# Patient Record
Sex: Female | Born: 1985 | Race: Black or African American | Hispanic: No | Marital: Single | Smoking: Never smoker
Health system: Southern US, Community
[De-identification: ages and names within clinical notes are randomized; demographics above are authoritative.]

## PROBLEM LIST (undated history)

## (undated) DIAGNOSIS — R51 Headache: Secondary | ICD-10-CM

## (undated) DIAGNOSIS — R87619 Unspecified abnormal cytological findings in specimens from cervix uteri: Secondary | ICD-10-CM

## (undated) HISTORY — DX: Headache: R51

## (undated) HISTORY — PX: WISDOM TOOTH EXTRACTION: SHX21

## (undated) HISTORY — PX: COLPOSCOPY VULVA: SUR281

## (undated) HISTORY — DX: Unspecified abnormal cytological findings in specimens from cervix uteri: R87.619

---

## 2009-11-11 HISTORY — PX: COLPOSCOPY VULVA W/ BIOPSY: SUR282

## 2012-08-13 ENCOUNTER — Emergency Department: Payer: Self-pay | Admitting: Emergency Medicine

## 2012-08-13 LAB — URINALYSIS, COMPLETE
Bilirubin,UR: NEGATIVE
Blood: NEGATIVE
Glucose,UR: NEGATIVE mg/dL (ref 0–75)
Nitrite: NEGATIVE
Squamous Epithelial: 13

## 2012-08-13 LAB — COMPREHENSIVE METABOLIC PANEL
Albumin: 3.6 g/dL (ref 3.4–5.0)
Alkaline Phosphatase: 36 U/L — ABNORMAL LOW (ref 50–136)
Anion Gap: 12 (ref 7–16)
BUN: 11 mg/dL (ref 7–18)
Bilirubin,Total: 0.3 mg/dL (ref 0.2–1.0)
Calcium, Total: 9.4 mg/dL (ref 8.5–10.1)
Chloride: 107 mmol/L (ref 98–107)
Creatinine: 0.85 mg/dL (ref 0.60–1.30)
Glucose: 79 mg/dL (ref 65–99)
Osmolality: 280 (ref 275–301)
Potassium: 3.2 mmol/L — ABNORMAL LOW (ref 3.5–5.1)
SGOT(AST): 20 U/L (ref 15–37)
Total Protein: 7.7 g/dL (ref 6.4–8.2)

## 2012-08-13 LAB — CBC
HCT: 40.3 % (ref 35.0–47.0)
HGB: 13.5 g/dL (ref 12.0–16.0)
MCH: 30.1 pg (ref 26.0–34.0)
MCHC: 33.5 g/dL (ref 32.0–36.0)
MCV: 90 fL (ref 80–100)
RBC: 4.48 10*6/uL (ref 3.80–5.20)

## 2012-10-26 ENCOUNTER — Ambulatory Visit: Payer: Self-pay | Admitting: Family Medicine

## 2012-10-29 ENCOUNTER — Ambulatory Visit: Payer: Self-pay | Admitting: Family Medicine

## 2012-11-09 ENCOUNTER — Ambulatory Visit (INDEPENDENT_AMBULATORY_CARE_PROVIDER_SITE_OTHER): Payer: BC Managed Care – PPO | Admitting: Family Medicine

## 2012-11-09 ENCOUNTER — Other Ambulatory Visit (HOSPITAL_COMMUNITY)
Admission: RE | Admit: 2012-11-09 | Discharge: 2012-11-09 | Disposition: A | Discharge status: Home / Self Care | Payer: BC Managed Care – PPO | Source: Ambulatory Visit | Attending: Family Medicine | Admitting: Family Medicine

## 2012-11-09 ENCOUNTER — Encounter: Payer: Self-pay | Admitting: Family Medicine

## 2012-11-09 VITALS — BP 120/92 | HR 84 | Temp 98.1°F | Ht 61.5 in | Wt 184.0 lb

## 2012-11-09 DIAGNOSIS — Z1151 Encounter for screening for human papillomavirus (HPV): Secondary | ICD-10-CM | POA: Insufficient documentation

## 2012-11-09 DIAGNOSIS — E669 Obesity, unspecified: Secondary | ICD-10-CM

## 2012-11-09 DIAGNOSIS — Z113 Encounter for screening for infections with a predominantly sexual mode of transmission: Secondary | ICD-10-CM | POA: Insufficient documentation

## 2012-11-09 DIAGNOSIS — R87810 Cervical high risk human papillomavirus (HPV) DNA test positive: Secondary | ICD-10-CM | POA: Insufficient documentation

## 2012-11-09 DIAGNOSIS — Z0000 Encounter for general adult medical examination without abnormal findings: Secondary | ICD-10-CM | POA: Insufficient documentation

## 2012-11-09 DIAGNOSIS — Z01419 Encounter for gynecological examination (general) (routine) without abnormal findings: Secondary | ICD-10-CM | POA: Insufficient documentation

## 2012-11-09 DIAGNOSIS — Z136 Encounter for screening for cardiovascular disorders: Secondary | ICD-10-CM

## 2012-11-09 LAB — HIV ANTIBODY (ROUTINE TESTING W REFLEX): HIV: NONREACTIVE

## 2012-11-09 NOTE — Patient Instructions (Addendum)
Nice to meet you. Have a wonderful New Year.  We will call you with your lab results and pap smear results ( you will get a letter if pap smear is normal).  Try MyFitness Pal App.

## 2012-11-09 NOTE — Progress Notes (Signed)
Subjective:    Patient ID: Heidi Paul, female    DOB: 09/26/1986, 26 y.o.   MRN: [id number]  HPI 26 yo G0 here to establish care and for CPX/Pap.  Sexually active with fiance only, uses OCPs. Would like STD testing with pelvic today.  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  Trying to lose weight- does not exercise.  Did lose 15 pounds with Atkins diet previously.  Patient Active Problem List  Diagnosis  . Routine general medical examination at a health care facility  . Obesity (BMI 30.0-34.9)   No past medical history on file. No past surgical history on file. History  Substance Use Topics  . Smoking status: Never Smoker   . Smokeless tobacco: Not on file  . Alcohol Use: Not on file   Family History  Problem Relation Age of Onset  . Cancer Mother     prostate  . Diabetes Maternal Grandmother   . Hyperlipidemia Maternal Grandmother   . Hypertension Maternal Grandmother    Allergies  Allergen Reactions  . Biaxin (Clarithromycin) Hives and Swelling   Current Outpatient Prescriptions on File Prior to Visit  Medication Sig Dispense Refill  . desogestrel-ethinyl estradiol (KARIVA,AZURETTE,MIRCETTE) 0.15-0.02/0.01 MG (21/5) tablet Take 1 tablet by mouth daily.       The PMH, PSH, Social History, Family History, Medications, and allergies have been reviewed in [facility], and have been updated if relevant.    Review of Systems See HPI Patient reports no  vision/ hearing changes,anorexia, weight change, fever ,adenopathy, persistant / recurrent hoarseness, swallowing issues, chest pain, edema,persistant / recurrent cough, hemoptysis, dyspnea(rest, exertional, paroxysmal nocturnal), gastrointestinal  bleeding (melena, rectal bleeding), abdominal pain, excessive heart burn, GU symptoms(dysuria, hematuria, pyuria, voiding/incontinence  Issues) syncope, focal weakness, severe memory loss, concerning skin lesions, depression, anxiety, abnormal  bruising/bleeding, major joint swelling, breast masses or abnormal vaginal bleeding.       Objective:   Physical Exam BP 120/92  Pulse 84  Temp 98.1 F (36.7 C)  Ht 5' 1.5" (1.562 m)  Wt 184 lb (83.462 kg)  BMI 34.20 kg/m2  General:  Well-developed,well-nourished,in no acute distress; alert,appropriate and cooperative throughout examination Head:  normocephalic and atraumatic.   Eyes:  vision grossly intact, pupils equal, pupils round, and pupils reactive to light.   Ears:  R ear normal and L ear normal.   Nose:  no external deformity.   Mouth:  good dentition.   Neck:  No deformities, masses, or tenderness noted. Breasts:  No mass, nodules, thickening, tenderness, bulging, retraction, inflamation, nipple discharge or skin changes noted.   Lungs:  Normal respiratory effort, chest expands symmetrically. Lungs are clear to auscultation, no crackles or wheezes. Heart:  Normal rate and regular rhythm. S1 and S2 normal without gallop, murmur, click, rub or other extra sounds. Abdomen:  Bowel sounds positive,abdomen soft and non-tender without masses, organomegaly or hernias noted. Rectal:  no external abnormalities.   Genitalia:  Pelvic Exam:        External: normal female genitalia without lesions or masses        Vagina: normal without lesions or masses        Cervix: normal without lesions or masses        Adnexa: normal bimanual exam without masses or fullness        Uterus: normal by palpation        Pap smear: performed Msk:  No deformity or scoliosis noted of thoracic or lumbar spine.  Extremities:  No clubbing, cyanosis, edema, or deformity noted with normal full range of motion of all joints.   Neurologic:  alert & oriented X3 and gait normal.   Skin:  Intact without suspicious lesions or rashes Cervical Nodes:  No lymphadenopathy noted Axillary Nodes:  No palpable lymphadenopathy Psych:  Cognition and judgment appear intact. Alert and cooperative with normal attention span  and concentration. No apparent delusions, illusions, hallucinations        Assessment & Plan:   1. Routine general medical examination at a health care facility  Reviewed preventive care protocols, scheduled due services, and updated immunizations Discussed nutrition, exercise, diet, and healthy lifestyle.  Comprehensive metabolic panel, Cytology - PAP  2. Screening for STD (sexually transmitted disease)  HIV Antibody, RPR, Cytology - PAP  3. Screening for ischemic heart disease  Lipid Panel  4. Obesity (BMI 30.0-34.9)  Discussed food journals, try weight watchers or my fitness pal. Encouraged to get regular exercise.

## 2012-11-10 LAB — COMPREHENSIVE METABOLIC PANEL
ALT: 17 U/L (ref 0–35)
AST: 18 U/L (ref 0–37)
Calcium: 8.7 mg/dL (ref 8.4–10.5)
Chloride: 104 mEq/L (ref 96–112)
Creatinine, Ser: 0.9 mg/dL (ref 0.4–1.2)
Sodium: 136 mEq/L (ref 135–145)
Total Bilirubin: 0.3 mg/dL (ref 0.3–1.2)

## 2012-11-10 LAB — RPR

## 2012-11-10 LAB — LIPID PANEL
HDL: 96.8 mg/dL (ref >39.00)
VLDL: 33 mg/dL (ref 0.0–40.0)

## 2012-11-11 HISTORY — PX: LEEP: SHX91

## 2012-11-20 ENCOUNTER — Other Ambulatory Visit: Payer: Self-pay | Admitting: Family Medicine

## 2012-11-30 ENCOUNTER — Ambulatory Visit (INDEPENDENT_AMBULATORY_CARE_PROVIDER_SITE_OTHER): Payer: BC Managed Care – PPO | Admitting: Obstetrics and Gynecology

## 2012-11-30 ENCOUNTER — Encounter: Payer: Self-pay | Admitting: Obstetrics and Gynecology

## 2012-11-30 VITALS — BP 125/92 | HR 126 | Ht 61.0 in | Wt 186.0 lb

## 2012-11-30 DIAGNOSIS — R87610 Atypical squamous cells of undetermined significance on cytologic smear of cervix (ASC-US): Secondary | ICD-10-CM

## 2012-11-30 DIAGNOSIS — R87611 Atypical squamous cells cannot exclude high grade squamous intraepithelial lesion on cytologic smear of cervix (ASC-H): Secondary | ICD-10-CM

## 2012-11-30 DIAGNOSIS — N871 Moderate cervical dysplasia: Secondary | ICD-10-CM | POA: Insufficient documentation

## 2012-11-30 DIAGNOSIS — Z01812 Encounter for preprocedural laboratory examination: Secondary | ICD-10-CM

## 2012-11-30 NOTE — Progress Notes (Signed)
Patient ID: Heidi Paul, female   DOB: 1985-11-24, 27 y.o.   MRN: [id number] 27 yo G0 non-smoker with ASC-H on 11/09/2012 pap smear. Patient given informed consent, signed copy in the chart, time out was performed.  Placed in lithotomy position. Cervix viewed with speculum and colposcope after application of acetic acid.   Colposcopy adequate?  yes Acetowhite lesions?yes 10 o'clock Punctation?no Mosaicism?  no Abnormal vasculature?  no Biopsies?yes 10 o'clock ECC?yes  COMMENTS: Patient was given post procedure instructions.  She will return in 2 weeks for results. Patient completed Gardasil series in 2011 Patient with h/o abnormal pap smear in the past- will review records

## 2012-12-15 ENCOUNTER — Ambulatory Visit: Payer: BC Managed Care – PPO | Admitting: Obstetrics & Gynecology

## 2012-12-26 ENCOUNTER — Other Ambulatory Visit: Payer: Self-pay

## 2013-01-01 ENCOUNTER — Ambulatory Visit (INDEPENDENT_AMBULATORY_CARE_PROVIDER_SITE_OTHER): Payer: BC Managed Care – PPO | Admitting: Obstetrics and Gynecology

## 2013-01-01 ENCOUNTER — Other Ambulatory Visit: Payer: Self-pay | Admitting: Obstetrics and Gynecology

## 2013-01-01 VITALS — BP 124/97 | HR 126 | Ht 61.0 in | Wt 188.0 lb

## 2013-01-01 DIAGNOSIS — N871 Moderate cervical dysplasia: Secondary | ICD-10-CM

## 2013-01-01 DIAGNOSIS — Z01812 Encounter for preprocedural laboratory examination: Secondary | ICD-10-CM

## 2013-01-01 HISTORY — DX: Moderate cervical dysplasia: N87.1

## 2013-01-01 NOTE — Progress Notes (Signed)
Patient ID: Heidi Paul, female   DOB: [birth year], 27 y.o.   MRN: [id number] Patient identified, informed consent obtained, signed copy in chart, time out performed.  Pap smear and colposcopy reviewed.  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  Cervix visualized. Paracervical block placed.  Medium size LOOP used to remove cone of cervix using blend of cut and cautery on LEEP machine.  Edges/Base cauterized with Ball.  Monsel's solution used for hemostasis.  Patient tolerated procedure well.  Patient given post procedure instructions.  Follow up in 6 months for repeat pap or as needed.

## 2013-01-01 NOTE — Addendum Note (Signed)
Addended by: Barbara Cower on: 01/01/2013 10:33 AM   Modules accepted: Orders

## 2013-01-01 NOTE — Patient Instructions (Signed)
Loop Electrosurgical Excision Procedure  Care After  Refer to this sheet in the next few weeks. These instructions provide you with information on caring for yourself after your procedure. Your caregiver may also give you more specific instructions. Your treatment has been planned according to current medical practices, but problems sometimes occur. Call your caregiver if you have any problems or questions after your procedure.  HOME CARE INSTRUCTIONS   · Do not use tampons, douche, or have sexual intercourse for 2 weeks or as directed by your caregiver.  · Begin normal activities if you have no or minimal cramping or bleeding, unless directed otherwise by your caregiver.  · Take your temperature if you feel sick. Write down your temperature on paper, and tell your caregiver if you have a fever.  · Take all medicines as directed by your caregiver.  · Keep all your follow-up appointments and Pap tests as directed by your caregiver.  SEEK IMMEDIATE MEDICAL CARE IF:   · You have bleeding that is heavier or longer than a normal menstrual cycle.  · You have bleeding that is bright red.  · You have blood clots.  · You have a fever.  · You have increasing cramps or pain not relieved by medicine.  · You develop abdominal pain that does not seem to be related to the same area of earlier cramping and pain.  · You are lightheaded, unusually weak, or faint.  · You develop painful or bloody urination.  · You develop a bad smelling vaginal discharge.  MAKE SURE YOU:  · Understand these instructions.  · Will watch your condition.  · Will get help right away if you are not doing well or get worse.  Document Released: 07/11/2011 Document Revised: 01/20/2012 Document Reviewed: 07/11/2011  Exit[employer]® Patient Information ©2013 [employer].

## 2013-01-19 ENCOUNTER — Ambulatory Visit: Payer: BC Managed Care – PPO | Admitting: Obstetrics and Gynecology

## 2013-03-09 ENCOUNTER — Other Ambulatory Visit: Payer: Self-pay | Admitting: Family Medicine

## 2013-08-25 ENCOUNTER — Other Ambulatory Visit: Payer: Self-pay | Admitting: Family Medicine

## 2013-09-16 ENCOUNTER — Other Ambulatory Visit: Payer: Self-pay

## 2013-09-21 ENCOUNTER — Other Ambulatory Visit (HOSPITAL_COMMUNITY)
Admission: RE | Admit: 2013-09-21 | Discharge: 2013-09-21 | Disposition: A | Discharge status: Home / Self Care | Payer: BC Managed Care – PPO | Source: Ambulatory Visit | Attending: Internal Medicine | Admitting: Internal Medicine

## 2013-09-21 ENCOUNTER — Encounter: Payer: Self-pay | Admitting: Internal Medicine

## 2013-09-21 ENCOUNTER — Ambulatory Visit (INDEPENDENT_AMBULATORY_CARE_PROVIDER_SITE_OTHER): Payer: BC Managed Care – PPO | Admitting: Internal Medicine

## 2013-09-21 VITALS — BP 120/80 | HR 102 | Temp 98.6°F | Ht 62.0 in | Wt 190.0 lb

## 2013-09-21 DIAGNOSIS — E669 Obesity, unspecified: Secondary | ICD-10-CM | POA: Insufficient documentation

## 2013-09-21 DIAGNOSIS — Z01419 Encounter for gynecological examination (general) (routine) without abnormal findings: Secondary | ICD-10-CM | POA: Insufficient documentation

## 2013-09-21 DIAGNOSIS — N760 Acute vaginitis: Secondary | ICD-10-CM | POA: Insufficient documentation

## 2013-09-21 DIAGNOSIS — B373 Candidiasis of vulva and vagina: Secondary | ICD-10-CM

## 2013-09-21 DIAGNOSIS — Z0000 Encounter for general adult medical examination without abnormal findings: Secondary | ICD-10-CM

## 2013-09-21 DIAGNOSIS — Z113 Encounter for screening for infections with a predominantly sexual mode of transmission: Secondary | ICD-10-CM | POA: Insufficient documentation

## 2013-09-21 HISTORY — DX: Obesity, unspecified: E66.9

## 2013-09-21 LAB — COMPREHENSIVE METABOLIC PANEL
Alkaline Phosphatase: 28 U/L — ABNORMAL LOW (ref 39–117)
CO2: 26 mEq/L (ref 19–32)
Creatinine, Ser: 0.9 mg/dL (ref 0.4–1.2)
GFR: 101.95 mL/min (ref >60.00)
Glucose, Bld: 87 mg/dL (ref 70–99)
Sodium: 135 mEq/L (ref 135–145)
Total Bilirubin: 0.4 mg/dL (ref 0.3–1.2)
Total Protein: 7.7 g/dL (ref 6.0–8.3)

## 2013-09-21 LAB — CBC
HCT: 41.8 % (ref 36.0–46.0)
Hemoglobin: 13.8 g/dL (ref 12.0–15.0)
MCV: 90 fl (ref 78.0–100.0)
RBC: 4.64 Mil/uL (ref 3.87–5.11)

## 2013-09-21 LAB — LIPID PANEL
Cholesterol: 269 mg/dL — ABNORMAL HIGH (ref 0–200)
HDL: 95.6 mg/dL (ref >39.00)
Total CHOL/HDL Ratio: 3
Triglycerides: 132 mg/dL (ref 0.0–149.0)
VLDL: 26.4 mg/dL (ref 0.0–40.0)

## 2013-09-21 LAB — LDL CHOLESTEROL, DIRECT: Direct LDL: 158.4 mg/dL

## 2013-09-21 MED ORDER — FLUCONAZOLE 150 MG PO TABS: 150.0000 mg | ORAL_TABLET | Freq: Once | ORAL | Status: DC

## 2013-09-21 NOTE — Progress Notes (Signed)
Pre-visit discussion using our clinic review tool. No additional management support is needed unless otherwise documented below in the visit note.  

## 2013-09-21 NOTE — Progress Notes (Signed)
Subjective:    Patient ID: Heidi Paul, female    DOB: 09-18-1986, 27 y.o.   MRN: [id number]  HPI  Pt presents to the clinic today for her annual exam. She is sexually active. She is on BCP, she has not missed any pills. She would like to be screen for STI's today.  Flu: never Tetanus: more than 10 years ago LMP: 08/28/2013 Pap Smear: 10/2012 (hx of abnormal cells)  Review of Systems      Past Medical History  Diagnosis Date  . Headache(784.0)      every other day.  . Abnormal Pap smear of cervix     x 3 , show positive hpv    Current Outpatient Prescriptions  Medication Sig Dispense Refill  . desogestrel-ethinyl estradiol (VIORELE) 0.15-0.02/0.01 MG (21/5) tablet TAKE 1 TABLET BY MOUTH EVERY DAY  28 tablet  2   No current facility-administered medications for this visit.    Allergies  Allergen Reactions  . Biaxin [Clarithromycin] Hives and Swelling    Family History  Problem Relation Age of Onset  . Diabetes Maternal Grandmother   . Hyperlipidemia Maternal Grandmother   . Hypertension Maternal Grandmother   . Cancer Father     prostate  . Hyperlipidemia Father   . Cancer Paternal Grandfather     prostate  . Hyperlipidemia Paternal Grandfather   . Hypertension Paternal Grandfather     History   Social History  . Marital Status: Single    Spouse Name: N/A    Number of Children: N/A  . Years of Education: N/A   Occupational History  . Not on file.   Social History Main Topics  . Smoking status: Never Smoker   . Smokeless tobacco: Not on file  . Alcohol Use: Yes     Comment: occasion  . Drug Use: Not on file  . Sexual Activity: Yes    Partners: Male    Birth Control/ Protection: Pill   Other Topics Concern  . 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     Constitutional: Denies fever, malaise, fatigue, headache or abrupt weight changes.  HEENT: Denies eye pain, eye redness, ear pain, ringing in the ears, wax  buildup, runny nose, nasal congestion, bloody nose, or sore throat. Respiratory: Denies difficulty breathing, shortness of breath, cough or sputum production.   Cardiovascular: Denies chest pain, chest tightness, palpitations or swelling in the hands or feet.  Gastrointestinal: Denies abdominal pain, bloating, constipation, diarrhea or blood in the stool.  GU: Denies urgency, frequency, pain with urination, burning sensation, blood in urine, odor or discharge. Musculoskeletal: Denies decrease in range of motion, difficulty with gait, muscle pain or joint pain and swelling.  Skin: Denies redness, rashes, lesions or ulcercations.  Neurological: Denies dizziness, difficulty with memory, difficulty with speech or problems with balance and coordination.   No other specific complaints in a complete review of systems (except as listed in HPI above).  Objective:   Physical Exam  BP 120/80  Pulse 102  Temp(Src) 98.6 F (37 C) (Tympanic)  Ht 5\' 2"  (1.575 m)  Wt 190 lb (86.183 kg)  BMI 34.74 kg/m2  SpO2 98% Wt Readings from Last 3 Encounters:  09/21/13 190 lb (86.183 kg)  01/01/13 188 lb (85.276 kg)  11/30/12 186 lb (84.369 kg)    Constitutional:  Alert, oriented x 4, well developed, well nourished in no apparent distress. Skin: Skin is warm and dry.  No erythema, lesion or  ulceration noted. HEENT: Head: normal shape and size; Eyes: sclera white, no icterus, conjunctiva pink, PERRLA and EOMs intact; Ears: Tm's gray and intact, normal light reflex; Nose: mucosa pink and moist, septum midline; Throat/Mouth: Teeth present, , mucosa pink and moist, no lesions or ulcerations noted. Neck: Normal range of motion. Neck supple, trachea midline. No massses, lumps or thyromegaly present.  Cardiovascular: Normal rate and rhythm. S1,S2 noted.  No murmur, rubs or gallops noted. No JVD or BLE edema. No carotid bruits noted. Pulmonary/Chest: Normal effort and positive vesicular breath sounds. No respiratory  distress. No wheezes, rales or ronchi noted.  Abdomenl: Soft and nontender. Normal bowel sounds, no bruits noted. No distention or masses noted. Liver, spleen and kidneys non palpable. Genitourinary: Normal female anatomy. Uterus midline, anterior and soft. No CMT or discharge noted. Adenexa non palpable. Breast without lumps or masses.  Musculoskeletal: Normal range of motion. Patient exhibits no effusions.  Neurological: Alert and oriented. Cranial nerves II-XII intact. Coordination normal. +DTRs bilaterally. Psychiatric: She has a normal mood and affect. Behavior is normal. Judgment and thought content normal.          Assessment & Plan:   Preventative Health Maintenance:  Screening blood work today including HIV and RPR Advised pt to incorporate more fruits and veggies into her diet and exercise on a regular basis Pap smear done today including STI screen- will call you with the results eRx for diflucan 150 mg po x 1 for vaginal yeast  RTC in 1 year or sooner if needed

## 2013-09-21 NOTE — Patient Instructions (Signed)

## 2013-09-22 LAB — HIV ANTIBODY (ROUTINE TESTING W REFLEX): HIV: NONREACTIVE

## 2013-11-16 ENCOUNTER — Other Ambulatory Visit: Payer: Self-pay | Admitting: Family Medicine

## 2014-11-17 ENCOUNTER — Other Ambulatory Visit: Payer: Self-pay | Admitting: Family Medicine

## 2014-11-21 ENCOUNTER — Other Ambulatory Visit: Payer: Self-pay

## 2014-11-21 MED ORDER — DESOGESTREL-ETHINYL ESTRADIOL 0.15-0.02/0.01 MG (21/5) PO TABS: 1.0000 | ORAL_TABLET | Freq: Every day | ORAL | Status: DC

## 2014-11-21 NOTE — Telephone Encounter (Signed)
t left v/m requesting refill viorele to [facility]; pt has CPX scheduled 02/06/15; spoke with Vicente Males at [facility] and [facility] is same med as on pts med list. Pt did not require cb.

## 2015-01-27 ENCOUNTER — Other Ambulatory Visit: Payer: Self-pay | Admitting: Family Medicine

## 2015-01-27 DIAGNOSIS — Z0000 Encounter for general adult medical examination without abnormal findings: Secondary | ICD-10-CM

## 2015-01-30 ENCOUNTER — Other Ambulatory Visit (INDEPENDENT_AMBULATORY_CARE_PROVIDER_SITE_OTHER): Payer: BC Managed Care – PPO

## 2015-01-30 DIAGNOSIS — Z0000 Encounter for general adult medical examination without abnormal findings: Secondary | ICD-10-CM

## 2015-01-31 LAB — CBC WITH DIFFERENTIAL/PLATELET
Basophils Absolute: 0 10*3/uL (ref 0.0–0.1)
Basophils Relative: 0.6 % (ref 0.0–3.0)
EOS PCT: 1.1 % (ref 0.0–5.0)
Eosinophils Absolute: 0.1 10*3/uL (ref 0.0–0.7)
HCT: 41.3 % (ref 36.0–46.0)
Hemoglobin: 13.8 g/dL (ref 12.0–15.0)
Lymphocytes Relative: 50.3 % — ABNORMAL HIGH (ref 12.0–46.0)
Lymphs Abs: 4 10*3/uL (ref 0.7–4.0)
MCHC: 33.5 g/dL (ref 30.0–36.0)
MCV: 88.5 fl (ref 78.0–100.0)
MONO ABS: 0.5 10*3/uL (ref 0.1–1.0)
Monocytes Relative: 6 % (ref 3.0–12.0)
Neutro Abs: 3.4 10*3/uL (ref 1.4–7.7)
Neutrophils Relative %: 42 % — ABNORMAL LOW (ref 43.0–77.0)
Platelets: 391 10*3/uL (ref 150.0–400.0)
RBC: 4.67 Mil/uL (ref 3.87–5.11)
RDW: 13.7 % (ref 11.5–15.5)
WBC: 8 10*3/uL (ref 4.0–10.5)

## 2015-01-31 LAB — COMPREHENSIVE METABOLIC PANEL
ALK PHOS: 25 U/L — AB (ref 39–117)
ALT: 10 U/L (ref 0–35)
AST: 16 U/L (ref 0–37)
Albumin: 4.1 g/dL (ref 3.5–5.2)
BUN: 13 mg/dL (ref 6–23)
CALCIUM: 9.5 mg/dL (ref 8.4–10.5)
CHLORIDE: 103 meq/L (ref 96–112)
CO2: 24 mEq/L (ref 19–32)
CREATININE: 0.88 mg/dL (ref 0.40–1.20)
GFR: 98.28 mL/min (ref >60.00)
Glucose, Bld: 81 mg/dL (ref 70–99)
Potassium: 3.8 mEq/L (ref 3.5–5.1)
Sodium: 135 mEq/L (ref 135–145)
Total Bilirubin: 0.3 mg/dL (ref 0.2–1.2)
Total Protein: 7.3 g/dL (ref 6.0–8.3)

## 2015-01-31 LAB — LIPID PANEL
CHOLESTEROL: 245 mg/dL — AB (ref 0–200)
HDL: 79.1 mg/dL (ref >39.00)
LDL CALC: 136 mg/dL — AB (ref 0–99)
NonHDL: 165.9
TRIGLYCERIDES: 149 mg/dL (ref 0.0–149.0)
Total CHOL/HDL Ratio: 3
VLDL: 29.8 mg/dL (ref 0.0–40.0)

## 2015-01-31 LAB — TSH: TSH: 1.8 u[IU]/mL (ref 0.35–4.50)

## 2015-02-04 ENCOUNTER — Other Ambulatory Visit: Payer: Self-pay | Admitting: Family Medicine

## 2015-02-06 ENCOUNTER — Ambulatory Visit (INDEPENDENT_AMBULATORY_CARE_PROVIDER_SITE_OTHER): Payer: BC Managed Care – PPO | Admitting: Family Medicine

## 2015-02-06 ENCOUNTER — Other Ambulatory Visit (HOSPITAL_COMMUNITY)
Admission: RE | Admit: 2015-02-06 | Discharge: 2015-02-06 | Disposition: A | Discharge status: Home / Self Care | Payer: BC Managed Care – PPO | Source: Ambulatory Visit | Attending: Family Medicine | Admitting: Family Medicine

## 2015-02-06 ENCOUNTER — Encounter: Payer: Self-pay | Admitting: Family Medicine

## 2015-02-06 VITALS — BP 116/74 | HR 88 | Temp 99.0°F | Ht 61.5 in | Wt 199.8 lb

## 2015-02-06 DIAGNOSIS — N760 Acute vaginitis: Secondary | ICD-10-CM | POA: Insufficient documentation

## 2015-02-06 DIAGNOSIS — Z01419 Encounter for gynecological examination (general) (routine) without abnormal findings: Secondary | ICD-10-CM | POA: Insufficient documentation

## 2015-02-06 DIAGNOSIS — Z113 Encounter for screening for infections with a predominantly sexual mode of transmission: Secondary | ICD-10-CM | POA: Insufficient documentation

## 2015-02-06 DIAGNOSIS — Z1151 Encounter for screening for human papillomavirus (HPV): Secondary | ICD-10-CM | POA: Insufficient documentation

## 2015-02-06 DIAGNOSIS — E669 Obesity, unspecified: Secondary | ICD-10-CM

## 2015-02-06 NOTE — Assessment & Plan Note (Signed)
Reviewed preventive care protocols, scheduled due services, and updated immunizations Discussed nutrition, exercise, diet, and healthy lifestyle.  Pap smear done today.  Declines influenza vaccine.

## 2015-02-06 NOTE — Progress Notes (Signed)
Subjective:   Patient ID: Heidi Paul, female    DOB: July 08, 1986, 29 y.o.   MRN: [id number]  Heidi Paul is a pleasant 29 y.o. year old female who presents to clinic today with Annual Exam  on 02/06/2015  HPI:  Last pap smear 09/23/13- done by Webb Silversmith. 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   Since that time, pap smears have been normal.  On OCPs.  No spotting or issues with current OCP.  Obesity- has been working with myfitness pal to decrease caloric intake.  Has not been exercising much though.  Lab Results  Component Value Date   WBC 8.0 01/30/2015   HGB 13.8 01/30/2015   HCT 41.3 01/30/2015   MCV 88.5 01/30/2015   PLT 391.0 01/30/2015   Lab Results  Component Value Date   CHOL 245* 01/30/2015   HDL 79.10 01/30/2015   LDLCALC 136* 01/30/2015   LDLDIRECT 158.4 09/21/2013   TRIG 149.0 01/30/2015   CHOLHDL 3 01/30/2015   Lab Results  Component Value Date   CREATININE 0.88 01/30/2015   Lab Results  Component Value Date   TSH 1.80 01/30/2015   Lab Results  Component Value Date   ALT 10 01/30/2015   AST 16 01/30/2015   ALKPHOS 25* 01/30/2015   BILITOT 0.3 01/30/2015   Current Outpatient Prescriptions on File Prior to Visit  Medication Sig Dispense Refill  . desogestrel-ethinyl estradiol (KARIVA,AZURETTE,MIRCETTE) 0.15-0.02/0.01 MG (21/5) tablet Take 1 tablet by mouth daily. 28 tablet 2   No current facility-administered medications on file prior to visit.    Allergies  Allergen Reactions  . Biaxin [Clarithromycin] Hives and Swelling    Past Medical History  Diagnosis Date  . Headache(784.0)      every other day.  . Abnormal Pap smear of cervix     x 3 , show positive hpv    Past Surgical History  Procedure Laterality Date  . Wisdom tooth extraction      x4  . Colposcopy vulva w/ biopsy  2011    abnormal pap/ hpv  . Colposcopy vulva  2012 and 2011    x2 /positive hpv    Family History  Problem Relation Age of Onset  .  Diabetes Maternal Grandmother   . Hyperlipidemia Maternal Grandmother   . Hypertension Maternal Grandmother   . Cancer Father     prostate  . Hyperlipidemia Father   . Cancer Paternal Grandfather     prostate  . Hyperlipidemia Paternal Grandfather   . Hypertension Paternal Grandfather     History   Social History  . Marital Status: Single    Spouse Name: N/A  . Number of Children: N/A  . Years of Education: N/A   Occupational History  . Not on file.   Social History Main Topics  . Smoking status: Never Smoker   . Smokeless tobacco: Not on file  . Alcohol Use: Yes     Comment: occasion  . Drug Use: Not on file  . Sexual Activity:    Partners: Male    Birth Control/ Protection: Pill   Other Topics Concern  . 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     Review of Systems  Constitutional: Negative.   HENT: Negative.   Eyes: Negative.   Respiratory: Negative.   Cardiovascular: Negative.   Gastrointestinal: Negative.   Endocrine: Negative.   Genitourinary: Negative.  Negative for difficulty urinating.  Musculoskeletal:  Negative.   Skin: Negative.   Allergic/Immunologic: Negative.   Neurological: Negative.   Hematological: Negative.   Psychiatric/Behavioral: Negative.   All other systems reviewed and are negative.      Objective:    BP 116/74 mmHg  Pulse 88  Temp(Src) 99 F (37.2 C) (Oral)  Ht 5' 1.5" (1.562 m)  Wt 199 lb 12 oz (90.606 kg)  BMI 37.14 kg/m2  SpO2 96%  LMP 01/10/2015 (Within Days)   Physical Exam    General:  Well-developed,well-nourished,in no acute distress; alert,appropriate and cooperative throughout examination Head:  normocephalic and atraumatic.   Eyes:  vision grossly intact, pupils equal, pupils round, and pupils reactive to light.   Ears:  R ear normal and L ear normal.   Nose:  no external deformity.   Mouth:  good dentition.   Neck:  No deformities, masses, or tenderness  noted. Breasts:  No mass, nodules, thickening, tenderness, bulging, retraction, inflamation, nipple discharge or skin changes noted.   Lungs:  Normal respiratory effort, chest expands symmetrically. Lungs are clear to auscultation, no crackles or wheezes. Heart:  Normal rate and regular rhythm. S1 and S2 normal without gallop, murmur, click, rub or other extra sounds. Abdomen:  Bowel sounds positive,abdomen soft and non-tender without masses, organomegaly or hernias noted. Rectal:  no external abnormalities.   Genitalia:  Pelvic Exam:        External: normal female genitalia without lesions or masses        Vagina: normal without lesions or masses        Cervix: normal without lesions or masses        Adnexa: normal bimanual exam without masses or fullness        Uterus: normal by palpation        Pap smear: performed Msk:  No deformity or scoliosis noted of thoracic or lumbar spine.   Extremities:  No clubbing, cyanosis, edema, or deformity noted with normal full range of motion of all joints.   Neurologic:  alert & oriented X3 and gait normal.   Skin:  Intact without suspicious lesions or rashes Cervical Nodes:  No lymphadenopathy noted Axillary Nodes:  No palpable lymphadenopathy Psych:  Cognition and judgment appear intact. Alert and cooperative with normal attention span and concentration. No apparent delusions, illusions, hallucinations      Assessment & Plan:   Well woman exam with routine gynecological exam  Obesity (BMI 30.0-34.9)  Screening for STD (sexually transmitted disease) - Plan: HIV antibody (with reflex), RPR No Follow-up on file.

## 2015-02-06 NOTE — Assessment & Plan Note (Signed)
Discussed increasing physical activity to at least 30 minutes per day.

## 2015-02-06 NOTE — Addendum Note (Signed)
Addended by: Modena Nunnery on: 02/06/2015 08:55 AM   Modules accepted: Orders

## 2015-02-06 NOTE — Patient Instructions (Signed)
Great to see you.  We will call you with your results. 

## 2015-02-06 NOTE — Progress Notes (Signed)
Pre visit review using our clinic review tool, if applicable. No additional management support is needed unless otherwise documented below in the visit note. 

## 2015-02-07 LAB — RPR

## 2015-02-07 LAB — HIV ANTIBODY (ROUTINE TESTING W REFLEX): HIV 1&2 Ab, 4th Generation: NONREACTIVE

## 2015-02-08 ENCOUNTER — Encounter: Payer: Self-pay | Admitting: *Deleted

## 2015-02-08 LAB — CERVICOVAGINAL ANCILLARY ONLY: Candida vaginitis: NEGATIVE

## 2015-02-08 LAB — CYTOLOGY - PAP

## 2015-02-13 LAB — CERVICOVAGINAL ANCILLARY ONLY: Herpes: NEGATIVE

## 2015-03-20 ENCOUNTER — Ambulatory Visit (INDEPENDENT_AMBULATORY_CARE_PROVIDER_SITE_OTHER): Payer: BC Managed Care – PPO | Admitting: Family Medicine

## 2015-03-20 ENCOUNTER — Encounter: Payer: Self-pay | Admitting: Family Medicine

## 2015-03-20 VITALS — BP 126/74 | HR 124 | Temp 98.1°F | Wt 196.0 lb

## 2015-03-20 DIAGNOSIS — N912 Amenorrhea, unspecified: Secondary | ICD-10-CM

## 2015-03-20 DIAGNOSIS — Z113 Encounter for screening for infections with a predominantly sexual mode of transmission: Secondary | ICD-10-CM

## 2015-03-20 HISTORY — DX: Amenorrhea, unspecified: N91.2

## 2015-03-20 NOTE — Progress Notes (Signed)
   Subjective:   Patient ID: Heidi Paul, female    DOB: May 11, 1986, 29 y.o.   MRN: [id number]  Heidi Paul is a pleasant 29 y.o. year old female who presents to clinic today with Amenorrhea  on 03/20/2015  HPI:  Amenorrhea- has not had a full period since March.  Has been on same OCP since high school and has never had this issue in past.  Has not missed any doses.  She has had unprotected sex. Denies any nausea, headache, or breast tenderness.  Has taken 3 HPTs, all negative.  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 Current Outpatient Prescriptions on File Prior to Visit  Medication Sig Dispense Refill  . VIORELE 0.15-0.02/0.01 MG (21/5) tablet TAKE 1 TABLET BY MOUTH DAILY. 28 tablet 11   No current facility-administered medications on file prior to visit.    Allergies  Allergen Reactions  . Biaxin [Clarithromycin] Hives and Swelling    Past Medical History  Diagnosis Date  . Headache(784.0)      every other day.  . Abnormal Pap smear of cervix     x 3 , show positive hpv    Past Surgical History  Procedure Laterality Date  . Wisdom tooth extraction      x4  . Colposcopy vulva w/ biopsy  2011    abnormal pap/ hpv  . Colposcopy vulva  2012 and 2011    x2 /positive hpv    Family History  Problem Relation Age of Onset  . Diabetes Maternal Grandmother   . Hyperlipidemia Maternal Grandmother   . Hypertension Maternal Grandmother   . Cancer Father     prostate  . Hyperlipidemia Father   . Cancer Paternal Grandfather     prostate  . Hyperlipidemia Paternal Grandfather   . Hypertension Paternal Grandfather     History   Social History  . Marital Status: Single    Spouse Name: N/A  . Number of Children: N/A  . Years of Education: N/A   Occupational History  . Not on file.   Social History Main Topics  . Smoking status: Never Smoker   . Smokeless tobacco: Not on file  . Alcohol Use: Yes     Comment:  occasion  . Drug Use: Not on file  . Sexual Activity:    Partners: Male    Birth Control/ Protection: Pill   Other Topics Concern  . 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   The PMH, PSH, Social History, Family History, Medications, and allergies have been reviewed in [facility], and have been updated if relevant.   Review of Systems  Constitutional: Negative.   Gastrointestinal: Negative.   Genitourinary: Positive for menstrual problem. Negative for pelvic pain.  Psychiatric/Behavioral: Negative for suicidal ideas, behavioral problems, confusion, sleep disturbance and dysphoric mood. The patient is nervous/anxious. The patient is not hyperactive.   All other systems reviewed and are negative.      Objective:    BP 126/74 mmHg  Pulse 124  Temp(Src) 98.1 F (36.7 C) (Oral)  Wt 196 lb (88.905 kg)  SpO2 97%  LMP 01/10/2015 (Within Days)   Physical Exam        Assessment & Plan:   Amenorrhea - Plan: hCG, quantitative, pregnancy  Screening for STD (sexually transmitted disease) - Plan: HIV antibody (with reflex), RPR No Follow-up on file.

## 2015-03-20 NOTE — Assessment & Plan Note (Signed)
New- likely due to acute stressors. Check hcg serum for pt reassurance. The patient indicates understanding of these issues and agrees with the plan.

## 2015-03-20 NOTE — Assessment & Plan Note (Signed)
Orders Placed This Encounter  Procedures  . hCG, quantitative, pregnancy  . HIV antibody (with reflex)  . RPR

## 2015-03-20 NOTE — Progress Notes (Signed)
Pre visit review using our clinic review tool, if applicable. No additional management support is needed unless otherwise documented below in the visit note. 

## 2015-03-21 ENCOUNTER — Telehealth: Payer: Self-pay | Admitting: Family Medicine

## 2015-03-21 LAB — RPR

## 2015-03-21 LAB — HCG, QUANTITATIVE, PREGNANCY: Quantitative HCG: 0.2 m[IU]/mL

## 2015-03-21 LAB — HIV ANTIBODY (ROUTINE TESTING W REFLEX): HIV 1&2 Ab, 4th Generation: NONREACTIVE

## 2015-03-21 NOTE — Telephone Encounter (Signed)
See results note. 

## 2015-03-21 NOTE — Telephone Encounter (Signed)
Pt returned your call.  

## 2015-10-30 ENCOUNTER — Telehealth: Payer: Self-pay | Admitting: Family Medicine

## 2015-10-30 ENCOUNTER — Ambulatory Visit (INDEPENDENT_AMBULATORY_CARE_PROVIDER_SITE_OTHER): Payer: BC Managed Care – PPO | Admitting: Primary Care

## 2015-10-30 ENCOUNTER — Encounter: Payer: Self-pay | Admitting: Primary Care

## 2015-10-30 VITALS — BP 142/94 | HR 105 | Temp 98.9°F | Ht 61.5 in | Wt 202.8 lb

## 2015-10-30 DIAGNOSIS — N63 Unspecified lump in unspecified breast: Secondary | ICD-10-CM

## 2015-10-30 NOTE — Telephone Encounter (Signed)
Pt has appt 10/30/15 at 4PM with Allie Bossier NP.

## 2015-10-30 NOTE — Progress Notes (Signed)
Pre visit review using our clinic review tool, if applicable. No additional management support is needed unless otherwise documented below in the visit note. 

## 2015-10-30 NOTE — Patient Instructions (Signed)
Stop by the front and speak with Heidi Paul regarding the ultrasound to your breast.  It was a pleasure meeting you!  Breast Cyst A breast cyst is a sac in the breast that is filled with fluid. Breast cysts are common in women. Women can have one or many cysts. When the breasts contain many cysts, it is usually due to a noncancerous (benign) condition called fibrocystic change. These lumps form under the influence of female hormones (estrogen and progesterone). The lumps are most often located in the upper, outer portion of the breast. They are often more swollen, painful, and tender before your period starts. They usually disappear after menopause, unless you are on hormone therapy.  There are several types of cysts:  Macrocyst. This is a cyst that is about 2 in. (5.1 cm) in diameter.   Microcyst. This is a tiny cyst that you cannot feel but can be seen with a mammogram or an ultrasound.   Galactocele. This is a cyst containing milk that may develop if you suddenly stop breastfeeding.   Sebaceous cyst of the skin. This type of cyst is not in the breast tissue itself. Breast cysts do not increase your risk of breast cancer. However, they must be monitored closely because they can be cancerous.  CAUSES  It is not known exactly what causes a breast cyst to form. Possible causes include:  An overgrowth of milk glands and connective tissue in the breast can block the milk glands, causing them to fill with fluid.   Scar tissue in the breast from previous surgery may block the glands, causing a cyst.  RISK FACTORS Estrogen may influence the development of a breast cyst.  SIGNS AND SYMPTOMS   Feeling a smooth, round, soft lump (like a grape) in the breast that is easily moveable.   Breast discomfort or pain.  Increase in size of the lump before your menstrual period and decrease in its size after your menstrual period.  DIAGNOSIS  A cyst can be felt during a physical exam by your health  care provider. A breast X-ray exam (mammogram) and ultrasonography will be done to confirm the diagnosis. Fluid may be removed from the cyst with a needle (fine needle aspiration) to make sure the cyst is not cancerous.  TREATMENT  Treatment may not be necessary. Your health care provider may monitor the cyst to see if it goes away on its own. If treatment is needed, it may include:  Hormone treatment.   Needle aspiration. There is a chance of the cyst coming back after aspiration.   Surgery to remove the whole cyst.  HOME CARE INSTRUCTIONS   Keep all follow-up appointments with your health care provider.  See your health care provider regularly:  Get a yearly exam by your health care provider.  Have a clinical breast exam by a health care provider every 1-3 years if you are 92-76 years of age. After age 85 years, you should have the exam every year.   Get mammogram tests as directed by your health care provider.   Understand the normal appearance and feel of your breasts and perform breast self-exams.   Only take over-the-counter or prescription medicines as directed by your health care provider.   Wear a supportive bra, especially when exercising.   Avoid caffeine.   Reduce your salt intake, especially before your menstrual period. Too much salt can cause fluid retention, breast swelling, and discomfort.  SEEK MEDICAL CARE IF:   You feel, or think you  feel, a lump in your breast.   You notice that both breasts look or feel different than usual.   Your breast is still causing pain after your menstrual period is over.   You need medicine for breast pain and swelling that occurs with your menstrual period.  SEEK IMMEDIATE MEDICAL CARE IF:   You have severe pain, tenderness, redness, or warmth in your breast.   You have nipple discharge or bleeding.   Your breast lump becomes hard and painful.   You find new lumps or bumps that were not there before.    You feel lumps in your armpit (axilla).   You notice dimpling or wrinkling of the breast or nipple.   You have a fever.  MAKE SURE YOU:  Understand these instructions.  Will watch your condition.  Will get help right away if you are not doing well or get worse.   This information is not intended to replace advice given to you by your health care provider. Make sure you discuss any questions you have with your health care provider.   Document Released: 10/28/2005 Document Revised: 06/30/2013 Document Reviewed: 05/27/2013 Elsevier Interactive Patient Education [employer].

## 2015-10-30 NOTE — Telephone Encounter (Signed)
Patient Name: Heidi Paul  DOB: June 13, 1986    Initial Comment Caller states fiancee has lump on her breast.   Nurse Assessment  Nurse: Orvan Seen, RN, Jacquilin Date/Time (Eastern Time): 10/30/2015 8:33:13 AM  Confirm and document reason for call. If symptomatic, describe symptoms. ---Caller states fianc has lump on her breast. Caller states his fianc is a Pharmacist, hospital and is unable to call in. Caller states he just wants to make a appointment.  Has the patient traveled out of the country within the last 30 days? ---Not Applicable  Does the patient have any new or worsening symptoms? ---No  Please document clinical information provided and list any resource used. ---Warm transfer to Rodman in the office.     Guidelines    Guideline Title Affirmed Question Affirmed Notes       Final Disposition User   Clinical Call Orvan Seen, RN, Jacquilin

## 2015-10-30 NOTE — Progress Notes (Signed)
Subjective:    Patient ID: Heidi Paul, female    DOB: 01-08-1986, 29 y.o.   MRN: [id number]  HPI  Heidi Paul is a 29 year old female who presents today with a chief complaint of breast mass. Her mass is located to the left breast at the 2 o'clock position. She first noticed it Friday afternoon after getting out of the shower. Her mass is about the size of a marble. Denies pain, FH history of breast cancer. Overall she's noticed a slight decrease in size.   Review of Systems  Constitutional: Negative for fever and chills.  Respiratory: Negative for cough.   Skin:       Mass to left breast tissue       Past Medical History  Diagnosis Date  . Headache(784.0)      every other day.  . Abnormal Pap smear of cervix     x 3 , show positive hpv    Social History   Social History  . Marital Status: Single    Spouse Name: N/A  . Number of Children: N/A  . Years of Education: N/A   Occupational History  . Not on file.   Social History Main Topics  . Smoking status: Never Smoker   . Smokeless tobacco: Not on file  . Alcohol Use: Yes     Comment: occasion  . Drug Use: Not on file  . Sexual Activity:    Partners: Male    Birth Control/ Protection: Pill   Other Topics Concern  . 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    Past Surgical History  Procedure Laterality Date  . Wisdom tooth extraction      x4  . Colposcopy vulva w/ biopsy  2011    abnormal pap/ hpv  . Colposcopy vulva  2012 and 2011    x2 /positive hpv    Family History  Problem Relation Age of Onset  . Diabetes Maternal Grandmother   . Hyperlipidemia Maternal Grandmother   . Hypertension Maternal Grandmother   . Cancer Father     prostate  . Hyperlipidemia Father   . Cancer Paternal Grandfather     prostate  . Hyperlipidemia Paternal Grandfather   . Hypertension Paternal Grandfather     Allergies  Allergen Reactions  . Biaxin [Clarithromycin] Hives and  Swelling    Current Outpatient Prescriptions on File Prior to Visit  Medication Sig Dispense Refill  . VIORELE 0.15-0.02/0.01 MG (21/5) tablet TAKE 1 TABLET BY MOUTH DAILY. 28 tablet 11   No current facility-administered medications on file prior to visit.    BP 142/94 mmHg  Pulse 105  Temp(Src) 98.9 F (37.2 C) (Oral)  Ht 5' 1.5" (1.562 m)  Wt 202 lb 12.8 oz (91.989 kg)  BMI 37.70 kg/m2  SpO2 98%  LMP 10/25/2015    Objective:   Physical Exam  Constitutional: She appears well-nourished.  Cardiovascular: Normal rate and regular rhythm.   Pulmonary/Chest: Effort normal and breath sounds normal. Left breast exhibits mass and skin change.  1 cm firm, immobile, mass to left breast at 2 o'clock position. Some erythema to skin tissue. Non tender.  Skin: Skin is warm and dry.          Assessment & Plan:  Breat Mass:  Located to 2 o'clock position of left breast. Immobile, non tender, mild erythema to skin. No recent illnesses, no FH of breast cancer. Suspect cyst as she finished  her menstrual cycle last week, and feels like a cyst. Will send for [place] of left breast to rule out other causes. Information provided regarding cysts of breasts.

## 2015-11-01 ENCOUNTER — Ambulatory Visit
Admission: RE | Admit: 2015-11-01 | Discharge: 2015-11-01 | Disposition: A | Discharge status: Home / Self Care | Payer: BC Managed Care – PPO | Source: Ambulatory Visit | Attending: Primary Care | Admitting: Primary Care

## 2015-11-01 DIAGNOSIS — N63 Unspecified lump in unspecified breast: Secondary | ICD-10-CM

## 2016-01-10 ENCOUNTER — Other Ambulatory Visit: Payer: Self-pay | Admitting: Family Medicine

## 2016-02-08 ENCOUNTER — Other Ambulatory Visit: Payer: Self-pay | Admitting: Family Medicine

## 2016-02-09 NOTE — Telephone Encounter (Signed)
Lm on pts vm and informed pt CPE required for additional refills.

## 2016-02-22 ENCOUNTER — Ambulatory Visit (INDEPENDENT_AMBULATORY_CARE_PROVIDER_SITE_OTHER): Payer: BC Managed Care – PPO | Admitting: Family Medicine

## 2016-02-22 ENCOUNTER — Other Ambulatory Visit (HOSPITAL_COMMUNITY)
Admission: RE | Admit: 2016-02-22 | Discharge: 2016-02-22 | Disposition: A | Discharge status: Home / Self Care | Payer: BC Managed Care – PPO | Source: Ambulatory Visit | Attending: Family Medicine | Admitting: Family Medicine

## 2016-02-22 ENCOUNTER — Encounter: Payer: BC Managed Care – PPO | Admitting: Family Medicine

## 2016-02-22 VITALS — BP 122/84 | HR 108 | Temp 98.3°F | Ht 61.0 in | Wt 201.0 lb

## 2016-02-22 DIAGNOSIS — Z01419 Encounter for gynecological examination (general) (routine) without abnormal findings: Secondary | ICD-10-CM | POA: Diagnosis present

## 2016-02-22 DIAGNOSIS — Z0000 Encounter for general adult medical examination without abnormal findings: Secondary | ICD-10-CM

## 2016-02-22 DIAGNOSIS — N760 Acute vaginitis: Secondary | ICD-10-CM | POA: Insufficient documentation

## 2016-02-22 DIAGNOSIS — Z113 Encounter for screening for infections with a predominantly sexual mode of transmission: Secondary | ICD-10-CM

## 2016-02-22 DIAGNOSIS — R87611 Atypical squamous cells cannot exclude high grade squamous intraepithelial lesion on cytologic smear of cervix (ASC-H): Secondary | ICD-10-CM

## 2016-02-22 LAB — LIPID PANEL
CHOL/HDL RATIO: 3
CHOLESTEROL: 242 mg/dL — AB (ref 0–200)
HDL: 81.7 mg/dL (ref >39.00)
LDL Cholesterol: 131 mg/dL — ABNORMAL HIGH (ref 0–99)
NonHDL: 160.01
TRIGLYCERIDES: 144 mg/dL (ref 0.0–149.0)
VLDL: 28.8 mg/dL (ref 0.0–40.0)

## 2016-02-22 LAB — COMPREHENSIVE METABOLIC PANEL
ALT: 19 U/L (ref 0–35)
AST: 19 U/L (ref 0–37)
Albumin: 4.1 g/dL (ref 3.5–5.2)
Alkaline Phosphatase: 27 U/L — ABNORMAL LOW (ref 39–117)
BILIRUBIN TOTAL: 0.3 mg/dL (ref 0.2–1.2)
BUN: 11 mg/dL (ref 6–23)
CALCIUM: 9.6 mg/dL (ref 8.4–10.5)
CHLORIDE: 104 meq/L (ref 96–112)
CO2: 23 meq/L (ref 19–32)
Creatinine, Ser: 0.87 mg/dL (ref 0.40–1.20)
GFR: 98.85 mL/min (ref >60.00)
Glucose, Bld: 91 mg/dL (ref 70–99)
Potassium: 3.6 mEq/L (ref 3.5–5.1)
Sodium: 136 mEq/L (ref 135–145)
Total Protein: 7.6 g/dL (ref 6.0–8.3)

## 2016-02-22 LAB — CBC WITH DIFFERENTIAL/PLATELET
BASOS PCT: 0.6 % (ref 0.0–3.0)
Basophils Absolute: 0 10*3/uL (ref 0.0–0.1)
Eosinophils Absolute: 0.2 10*3/uL (ref 0.0–0.7)
Eosinophils Relative: 2.3 % (ref 0.0–5.0)
HEMATOCRIT: 41.4 % (ref 36.0–46.0)
Hemoglobin: 13.7 g/dL (ref 12.0–15.0)
LYMPHS PCT: 47.3 % — AB (ref 12.0–46.0)
Lymphs Abs: 3.2 10*3/uL (ref 0.7–4.0)
MCHC: 33.2 g/dL (ref 30.0–36.0)
MCV: 88.7 fl (ref 78.0–100.0)
MONOS PCT: 7.7 % (ref 3.0–12.0)
Monocytes Absolute: 0.5 10*3/uL (ref 0.1–1.0)
NEUTROS ABS: 2.9 10*3/uL (ref 1.4–7.7)
Neutrophils Relative %: 42.1 % — ABNORMAL LOW (ref 43.0–77.0)
PLATELETS: 375 10*3/uL (ref 150.0–400.0)
RBC: 4.67 Mil/uL (ref 3.87–5.11)
RDW: 13.6 % (ref 11.5–15.5)
WBC: 6.8 10*3/uL (ref 4.0–10.5)

## 2016-02-22 LAB — TSH: TSH: 1.19 u[IU]/mL (ref 0.35–4.50)

## 2016-02-22 MED ORDER — DESOGESTREL-ETHINYL ESTRADIOL 0.15-0.02/0.01 MG (21/5) PO TABS: ORAL_TABLET | ORAL | Status: DC

## 2016-02-22 NOTE — Progress Notes (Signed)
Subjective:   Patient ID: Heidi Paul, female    DOB: 1986/06/27, 30 y.o.   MRN: [id number]  OLIS ROKOSZ is a pleasant 30 y.o. year old female who presents to clinic today with Annual Exam  on 02/22/2016  HPI:  Last pap smear 02/06/15- done by me and was normal. 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   Since that time, pap smears have been normal.  On OCPs.  No spotting or issues with current OCP.    Lab Results  Component Value Date   WBC 8.0 01/30/2015   HGB 13.8 01/30/2015   HCT 41.3 01/30/2015   MCV 88.5 01/30/2015   PLT 391.0 01/30/2015   Lab Results  Component Value Date   CHOL 245* 01/30/2015   HDL 79.10 01/30/2015   LDLCALC 136* 01/30/2015   LDLDIRECT 158.4 09/21/2013   TRIG 149.0 01/30/2015   CHOLHDL 3 01/30/2015   Lab Results  Component Value Date   CREATININE 0.88 01/30/2015   Lab Results  Component Value Date   TSH 1.80 01/30/2015   Lab Results  Component Value Date   ALT 10 01/30/2015   AST 16 01/30/2015   ALKPHOS 25* 01/30/2015   BILITOT 0.3 01/30/2015   Current Outpatient Prescriptions on File Prior to Visit  Medication Sig Dispense Refill  . VIORELE 0.15-0.02/0.01 MG (21/5) tablet TAKE 1 TABLET BY MOUTH DAILY. COMPLETE PHYSICAL EXAM REQUIRED FOR ADDITIONAL REFILLS 28 tablet 0   No current facility-administered medications on file prior to visit.    Allergies  Allergen Reactions  . Biaxin [Clarithromycin] Hives and Swelling    Past Medical History  Diagnosis Date  . Headache(784.0)      every other day.  . Abnormal Pap smear of cervix     x 3 , show positive hpv    Past Surgical History  Procedure Laterality Date  . Wisdom tooth extraction      x4  . Colposcopy vulva w/ biopsy  2011    abnormal pap/ hpv  . Colposcopy vulva  2012 and 2011    x2 /positive hpv    Family History  Problem Relation Age of Onset  . Diabetes Maternal Grandmother   . Hyperlipidemia Maternal Grandmother   . Hypertension Maternal  Grandmother   . Cancer Father     prostate  . Hyperlipidemia Father   . Cancer Paternal Grandfather     prostate  . Hyperlipidemia Paternal Grandfather   . Hypertension Paternal Grandfather     Social History   Social History  . Marital Status: Single    Spouse Name: N/A  . Number of Children: N/A  . Years of Education: N/A   Occupational History  . Not on file.   Social History Main Topics  . Smoking status: Never Smoker   . Smokeless tobacco: Not on file  . Alcohol Use: Yes     Comment: occasion  . Drug Use: Not on file  . Sexual Activity:    Partners: Male    Birth Control/ Protection: Pill   Other Topics Concern  . 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     Review of Systems  Constitutional: Negative.   HENT: Negative.   Eyes: Negative.   Respiratory: Negative.   Cardiovascular: Negative.   Gastrointestinal: Negative.   Endocrine: Negative.   Genitourinary: Negative.  Negative for difficulty urinating.  Musculoskeletal: Negative.   Skin: Negative.   Allergic/Immunologic: Negative.  Neurological: Negative.   Hematological: Negative.   Psychiatric/Behavioral: Negative.   All other systems reviewed and are negative.      Objective:    BP 122/84 mmHg  Pulse 108  Temp(Src) 98.3 F (36.8 C) (Oral)  Ht 5\' 1"  (1.549 m)  Wt 201 lb (91.173 kg)  BMI 38.00 kg/m2  SpO2 97%  LMP 02/14/2016   Physical Exam    General:  Well-developed,well-nourished,in no acute distress; alert,appropriate and cooperative throughout examination Head:  normocephalic and atraumatic.   Eyes:  vision grossly intact, pupils equal, pupils round, and pupils reactive to light.   Ears:  R ear normal and L ear normal.   Nose:  no external deformity.   Mouth:  good dentition.   Neck:  No deformities, masses, or tenderness noted. Breasts:  No mass, nodules, thickening, tenderness, bulging, retraction, inflamation, nipple discharge or skin  changes noted.   Lungs:  Normal respiratory effort, chest expands symmetrically. Lungs are clear to auscultation, no crackles or wheezes. Heart:  Normal rate and regular rhythm. S1 and S2 normal without gallop, murmur, click, rub or other extra sounds. Abdomen:  Bowel sounds positive,abdomen soft and non-tender without masses, organomegaly or hernias noted. Rectal:  no external abnormalities.   Genitalia:  Pelvic Exam:        External: normal female genitalia without lesions or masses        Vagina: normal without lesions or masses        Cervix: normal without lesions or masses        Adnexa: normal bimanual exam without masses or fullness        Uterus: normal by palpation        Pap smear: performed Msk:  No deformity or scoliosis noted of thoracic or lumbar spine.   Extremities:  No clubbing, cyanosis, edema, or deformity noted with normal full range of motion of all joints.   Neurologic:  alert & oriented X3 and gait normal.   Skin:  Intact without suspicious lesions or rashes Cervical Nodes:  No lymphadenopathy noted Axillary Nodes:  No palpable lymphadenopathy Psych:  Cognition and judgment appear intact. Alert and cooperative with normal attention span and concentration. No apparent delusions, illusions, hallucinations      Assessment & Plan:   Well woman exam with routine gynecological exam  Pap smear of cervix with ASCUS, cannot exclude HGSIL No Follow-up on file.

## 2016-02-22 NOTE — Progress Notes (Signed)
Pre visit review using our clinic review tool, if applicable. No additional management support is needed unless otherwise documented below in the visit note. 

## 2016-02-22 NOTE — Addendum Note (Signed)
Addended by: Pilar Grammes on: 02/22/2016 09:47 AM   Modules accepted: Orders

## 2016-02-22 NOTE — Assessment & Plan Note (Signed)
Reviewed preventive care protocols, scheduled due services, and updated immunizations Discussed nutrition, exercise, diet, and healthy lifestyle.  Pap smear today.  Orders Placed This Encounter  Procedures  . CBC with Differential/Platelet  . Comprehensive metabolic panel  . Lipid panel  . TSH  . HIV antibody (with reflex)  . RPR

## 2016-02-23 LAB — HIV ANTIBODY (ROUTINE TESTING W REFLEX): HIV: NONREACTIVE

## 2016-02-23 LAB — RPR

## 2016-02-26 LAB — CERVICOVAGINAL ANCILLARY ONLY
Bacterial vaginitis: NEGATIVE
Candida vaginitis: NEGATIVE
HERPES (WINDOWPATH): NEGATIVE

## 2016-02-26 LAB — CYTOLOGY - PAP

## 2016-02-27 ENCOUNTER — Encounter: Payer: Self-pay | Admitting: *Deleted

## 2017-02-05 ENCOUNTER — Other Ambulatory Visit: Payer: Self-pay | Admitting: Family Medicine

## 2017-03-03 ENCOUNTER — Other Ambulatory Visit: Payer: Self-pay | Admitting: Family Medicine

## 2017-04-03 ENCOUNTER — Other Ambulatory Visit: Payer: Self-pay

## 2017-04-03 MED ORDER — DESOGESTREL-ETHINYL ESTRADIOL 0.15-0.02/0.01 MG (21/5) PO TABS: 1.0000 | ORAL_TABLET | Freq: Every day | ORAL | 0 refills | Status: DC

## 2017-04-03 NOTE — Telephone Encounter (Signed)
Pt left v/m; pt scheduled CPX on 04/28/17 with Dr Deborra Medina and request refill BC pill to [facility]. Refilled per protocol and per DPR was going to leave v/m that refill done and pt to ck with [facility] but v/m was full. Tried x 3 to contact pt.

## 2017-04-03 NOTE — Telephone Encounter (Signed)
Pt called back she did not know her v/m was full. Advised pt refill done and pt will keep cpx appt.

## 2017-04-28 ENCOUNTER — Other Ambulatory Visit (HOSPITAL_COMMUNITY)
Admission: RE | Admit: 2017-04-28 | Discharge: 2017-04-28 | Disposition: A | Discharge status: Home / Self Care | Payer: BC Managed Care – PPO | Source: Ambulatory Visit | Attending: Family Medicine | Admitting: Family Medicine

## 2017-04-28 ENCOUNTER — Encounter: Payer: Self-pay | Admitting: Family Medicine

## 2017-04-28 ENCOUNTER — Ambulatory Visit (INDEPENDENT_AMBULATORY_CARE_PROVIDER_SITE_OTHER): Payer: BC Managed Care – PPO | Admitting: Family Medicine

## 2017-04-28 VITALS — BP 126/78 | HR 82 | Temp 97.7°F | Resp 14 | Ht 61.0 in | Wt 201.5 lb

## 2017-04-28 DIAGNOSIS — R87611 Atypical squamous cells cannot exclude high grade squamous intraepithelial lesion on cytologic smear of cervix (ASC-H): Secondary | ICD-10-CM | POA: Insufficient documentation

## 2017-04-28 DIAGNOSIS — Z01419 Encounter for gynecological examination (general) (routine) without abnormal findings: Secondary | ICD-10-CM | POA: Diagnosis not present

## 2017-04-28 LAB — CBC WITH DIFFERENTIAL/PLATELET
BASOS ABS: 0 10*3/uL (ref 0.0–0.1)
Basophils Relative: 0.5 % (ref 0.0–3.0)
EOS ABS: 0.1 10*3/uL (ref 0.0–0.7)
Eosinophils Relative: 1.9 % (ref 0.0–5.0)
HCT: 43.1 % (ref 36.0–46.0)
HEMOGLOBIN: 14.2 g/dL (ref 12.0–15.0)
Lymphocytes Relative: 53.3 % — ABNORMAL HIGH (ref 12.0–46.0)
Lymphs Abs: 3.5 10*3/uL (ref 0.7–4.0)
MCHC: 32.9 g/dL (ref 30.0–36.0)
MCV: 90.6 fl (ref 78.0–100.0)
MONO ABS: 0.5 10*3/uL (ref 0.1–1.0)
Monocytes Relative: 6.9 % (ref 3.0–12.0)
Neutro Abs: 2.4 10*3/uL (ref 1.4–7.7)
Neutrophils Relative %: 37.4 % — ABNORMAL LOW (ref 43.0–77.0)
Platelets: 347 10*3/uL (ref 150.0–400.0)
RBC: 4.75 Mil/uL (ref 3.87–5.11)
RDW: 13.5 % (ref 11.5–15.5)
WBC: 6.5 10*3/uL (ref 4.0–10.5)

## 2017-04-28 LAB — COMPREHENSIVE METABOLIC PANEL
ALT: 11 U/L (ref 0–35)
AST: 15 U/L (ref 0–37)
Albumin: 4.1 g/dL (ref 3.5–5.2)
Alkaline Phosphatase: 24 U/L — ABNORMAL LOW (ref 39–117)
BUN: 12 mg/dL (ref 6–23)
CO2: 21 mEq/L (ref 19–32)
CREATININE: 0.83 mg/dL (ref 0.40–1.20)
Calcium: 9.6 mg/dL (ref 8.4–10.5)
Chloride: 106 mEq/L (ref 96–112)
GFR: 103.53 mL/min (ref >60.00)
Glucose, Bld: 88 mg/dL (ref 70–99)
Potassium: 3.6 mEq/L (ref 3.5–5.1)
Sodium: 136 mEq/L (ref 135–145)
TOTAL PROTEIN: 7 g/dL (ref 6.0–8.3)
Total Bilirubin: 0.3 mg/dL (ref 0.2–1.2)

## 2017-04-28 LAB — LIPID PANEL
CHOLESTEROL: 267 mg/dL — AB (ref 0–200)
HDL: 81.3 mg/dL (ref >39.00)
LDL CALC: 157 mg/dL — AB (ref 0–99)
NonHDL: 185.77
Total CHOL/HDL Ratio: 3
Triglycerides: 145 mg/dL (ref 0.0–149.0)
VLDL: 29 mg/dL (ref 0.0–40.0)

## 2017-04-28 LAB — TSH: TSH: 1.22 u[IU]/mL (ref 0.35–4.50)

## 2017-04-28 MED ORDER — DESOGESTREL-ETHINYL ESTRADIOL 0.15-0.02/0.01 MG (21/5) PO TABS: 1.0000 | ORAL_TABLET | Freq: Every day | ORAL | 11 refills | Status: DC

## 2017-04-28 NOTE — Progress Notes (Signed)
Pre visit review using our clinic review tool, if applicable. No additional management support is needed unless otherwise documented below in the visit note. 

## 2017-04-28 NOTE — Addendum Note (Signed)
Addended byDamita Dunnings D on: 04/28/2017 09:54 AM   Modules accepted: Orders

## 2017-04-28 NOTE — Progress Notes (Signed)
Subjective:   Patient ID: Heidi Paul, female    DOB: 11/14/1985, 31 y.o.   MRN: [id number]  Heidi Paul is a pleasant 31 y.o. year old female who presents to clinic today with Annual Exam (last pap-02/2016)  on 04/28/2017  HPI:  Last pap smear 02/26/16- done by me and was normal. 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   Since that time, pap smears have been normal.  On OCPs.  No spotting or issues with current OCP.    Lab Results  Component Value Date   WBC 6.8 02/22/2016   HGB 13.7 02/22/2016   HCT 41.4 02/22/2016   MCV 88.7 02/22/2016   PLT 375.0 02/22/2016   Lab Results  Component Value Date   CHOL 242 (H) 02/22/2016   HDL 81.70 02/22/2016   LDLCALC 131 (H) 02/22/2016   LDLDIRECT 158.4 09/21/2013   TRIG 144.0 02/22/2016   CHOLHDL 3 02/22/2016   Lab Results  Component Value Date   CREATININE 0.87 02/22/2016   Lab Results  Component Value Date   TSH 1.19 02/22/2016   Lab Results  Component Value Date   ALT 19 02/22/2016   AST 19 02/22/2016   ALKPHOS 27 (L) 02/22/2016   BILITOT 0.3 02/22/2016   Current Outpatient Prescriptions on File Prior to Visit  Medication Sig Dispense Refill  . desogestrel-ethinyl estradiol (KARIVA) 0.15-0.02/0.01 MG (21/5) tablet Take 1 tablet by mouth daily. TAKE 1 TABLET BY MOUTH DAILY. NEEDS OFFICE VISIT 28 tablet 0   No current facility-administered medications on file prior to visit.     Allergies  Allergen Reactions  . Biaxin [Clarithromycin] Hives and Swelling    Past Medical History:  Diagnosis Date  . Abnormal Pap smear of cervix    x 3 , show positive hpv  . Headache(784.0)     every other day.    Past Surgical History:  Procedure Laterality Date  . COLPOSCOPY VULVA  2012 and 2011   x2 /positive hpv  . COLPOSCOPY VULVA W/ BIOPSY  2011   abnormal pap/ hpv  . WISDOM TOOTH EXTRACTION     x4    Family History  Problem Relation Age of Onset  . Diabetes Maternal Grandmother   . Hyperlipidemia  Maternal Grandmother   . Hypertension Maternal Grandmother   . Cancer Father        prostate  . Hyperlipidemia Father   . Cancer Paternal Grandfather        prostate  . Hyperlipidemia Paternal Grandfather   . Hypertension Paternal Grandfather     Social History   Social History  . Marital status: Single    Spouse name: N/A  . Number of children: N/A  . Years of education: N/A   Occupational History  . Not on file.   Social History Main Topics  . Smoking status: Never Smoker  . Smokeless tobacco: Never Used  . Alcohol use Yes     Comment: occasion  . Drug use: Unknown  . Sexual activity: Yes    Partners: Male    Birth control/ protection: Pill   Other Topics Concern  . 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     Review of Systems  Constitutional: Negative.   HENT: Negative.   Eyes: Negative.   Respiratory: Negative.   Cardiovascular: Negative.   Gastrointestinal: Negative.   Endocrine: Negative.   Genitourinary: Negative.  Negative for difficulty urinating.  Musculoskeletal: Negative.  Skin: Negative.   Allergic/Immunologic: Negative.   Neurological: Negative.   Hematological: Negative.   Psychiatric/Behavioral: Negative.   All other systems reviewed and are negative.      Objective:    BP 126/78 (BP Location: Left Arm, Patient Position: Sitting, Cuff Size: Normal)   Pulse 82   Temp 97.7 F (36.5 C) (Oral)   Resp 14   Ht 5\' 1"  (1.549 m)   Wt 201 lb 8 oz (91.4 kg)   LMP 04/02/2017   SpO2 97%   BMI 38.07 kg/m    Physical Exam    General:  Well-developed,well-nourished,in no acute distress; alert,appropriate and cooperative throughout examination Head:  normocephalic and atraumatic.   Eyes:  vision grossly intact, pupils equal, pupils round, and pupils reactive to light.   Ears:  R ear normal and L ear normal.   Nose:  no external deformity.   Mouth:  good dentition.   Neck:  No deformities, masses, or  tenderness noted. Breasts:  No mass, nodules, thickening, tenderness, bulging, retraction, inflamation, nipple discharge or skin changes noted.   Lungs:  Normal respiratory effort, chest expands symmetrically. Lungs are clear to auscultation, no crackles or wheezes. Heart:  Normal rate and regular rhythm. S1 and S2 normal without gallop, murmur, click, rub or other extra sounds. Abdomen:  Bowel sounds positive,abdomen soft and non-tender without masses, organomegaly or hernias noted. Rectal:  no external abnormalities.   Genitalia:  Pelvic Exam:        External: normal female genitalia without lesions or masses        Vagina: normal without lesions or masses        Cervix: normal without lesions or masses        Adnexa: normal bimanual exam without masses or fullness        Uterus: normal by palpation        Pap smear: performed Msk:  No deformity or scoliosis noted of thoracic or lumbar spine.   Extremities:  No clubbing, cyanosis, edema, or deformity noted with normal full range of motion of all joints.   Neurologic:  alert & oriented X3 and gait normal.   Skin:  Intact without suspicious lesions or rashes Cervical Nodes:  No lymphadenopathy noted Axillary Nodes:  No palpable lymphadenopathy Psych:  Cognition and judgment appear intact. Alert and cooperative with normal attention span and concentration. No apparent delusions, illusions, hallucinations      Assessment & Plan:   Well woman exam with routine gynecological exam  Pap smear of cervix with ASCUS, cannot exclude HGSIL No Follow-up on file.

## 2017-04-28 NOTE — Assessment & Plan Note (Signed)
Reviewed preventive care protocols, scheduled due services, and updated immunizations Discussed nutrition, exercise, diet, and healthy lifestyle.  Pap smear done today. 

## 2017-04-29 LAB — RPR

## 2017-04-29 LAB — HIV ANTIBODY (ROUTINE TESTING W REFLEX): HIV: NONREACTIVE

## 2017-04-30 LAB — CYTOLOGY - PAP
ADEQUACY: ABSENT
Bacterial vaginitis: NEGATIVE
CANDIDA VAGINITIS: NEGATIVE
CHLAMYDIA, DNA PROBE: NEGATIVE
DIAGNOSIS: NEGATIVE
HPV: NOT DETECTED
Neisseria Gonorrhea: NEGATIVE
Trichomonas: NEGATIVE

## 2017-05-01 LAB — CERVICOVAGINAL ANCILLARY ONLY: HERPES (WINDOWPATH): NEGATIVE

## 2018-03-29 ENCOUNTER — Other Ambulatory Visit: Payer: Self-pay | Admitting: Family Medicine

## 2018-04-29 ENCOUNTER — Other Ambulatory Visit (HOSPITAL_COMMUNITY)
Admission: RE | Admit: 2018-04-29 | Discharge: 2018-04-29 | Disposition: A | Discharge status: Home / Self Care | Payer: BC Managed Care – PPO | Source: Ambulatory Visit | Attending: Family Medicine | Admitting: Family Medicine

## 2018-04-29 ENCOUNTER — Ambulatory Visit (INDEPENDENT_AMBULATORY_CARE_PROVIDER_SITE_OTHER): Payer: BC Managed Care – PPO | Admitting: Family Medicine

## 2018-04-29 ENCOUNTER — Encounter: Payer: Self-pay | Admitting: Family Medicine

## 2018-04-29 VITALS — BP 130/102 | HR 96 | Temp 98.7°F | Ht 61.75 in | Wt 199.0 lb

## 2018-04-29 DIAGNOSIS — N898 Other specified noninflammatory disorders of vagina: Secondary | ICD-10-CM

## 2018-04-29 DIAGNOSIS — Z0000 Encounter for general adult medical examination without abnormal findings: Secondary | ICD-10-CM | POA: Diagnosis not present

## 2018-04-29 DIAGNOSIS — Z113 Encounter for screening for infections with a predominantly sexual mode of transmission: Secondary | ICD-10-CM | POA: Diagnosis not present

## 2018-04-29 DIAGNOSIS — Z23 Encounter for immunization: Secondary | ICD-10-CM

## 2018-04-29 DIAGNOSIS — Z01419 Encounter for gynecological examination (general) (routine) without abnormal findings: Secondary | ICD-10-CM

## 2018-04-29 LAB — TSH: TSH: 1.81 u[IU]/mL (ref 0.35–4.50)

## 2018-04-29 LAB — LIPID PANEL
Cholesterol: 301 mg/dL — ABNORMAL HIGH (ref 0–200)
HDL: 100.1 mg/dL (ref >39.00)
LDL Cholesterol: 178 mg/dL — ABNORMAL HIGH (ref 0–99)
NONHDL: 200.8
Total CHOL/HDL Ratio: 3
Triglycerides: 112 mg/dL (ref 0.0–149.0)
VLDL: 22.4 mg/dL (ref 0.0–40.0)

## 2018-04-29 LAB — COMPREHENSIVE METABOLIC PANEL
ALT: 9 U/L (ref 0–35)
AST: 10 U/L (ref 0–37)
Albumin: 4.2 g/dL (ref 3.5–5.2)
Alkaline Phosphatase: 27 U/L — ABNORMAL LOW (ref 39–117)
BILIRUBIN TOTAL: 0.3 mg/dL (ref 0.2–1.2)
BUN: 16 mg/dL (ref 6–23)
CO2: 25 mEq/L (ref 19–32)
Calcium: 9.6 mg/dL (ref 8.4–10.5)
Chloride: 104 mEq/L (ref 96–112)
Creatinine, Ser: 0.85 mg/dL (ref 0.40–1.20)
GFR: 100.06 mL/min (ref >60.00)
GLUCOSE: 92 mg/dL (ref 70–99)
Potassium: 4.3 mEq/L (ref 3.5–5.1)
SODIUM: 137 meq/L (ref 135–145)
TOTAL PROTEIN: 7.2 g/dL (ref 6.0–8.3)

## 2018-04-29 LAB — CBC WITH DIFFERENTIAL/PLATELET
BASOS ABS: 0 10*3/uL (ref 0.0–0.1)
Basophils Relative: 0.7 % (ref 0.0–3.0)
Eosinophils Absolute: 0.1 10*3/uL (ref 0.0–0.7)
Eosinophils Relative: 2.3 % (ref 0.0–5.0)
HCT: 42.3 % (ref 36.0–46.0)
Hemoglobin: 14.2 g/dL (ref 12.0–15.0)
LYMPHS ABS: 3 10*3/uL (ref 0.7–4.0)
Lymphocytes Relative: 51.9 % — ABNORMAL HIGH (ref 12.0–46.0)
MCHC: 33.7 g/dL (ref 30.0–36.0)
MCV: 90.7 fl (ref 78.0–100.0)
MONO ABS: 0.3 10*3/uL (ref 0.1–1.0)
MONOS PCT: 5.6 % (ref 3.0–12.0)
NEUTROS PCT: 39.5 % — AB (ref 43.0–77.0)
Neutro Abs: 2.3 10*3/uL (ref 1.4–7.7)
Platelets: 378 10*3/uL (ref 150.0–400.0)
RBC: 4.66 Mil/uL (ref 3.87–5.11)
RDW: 13.7 % (ref 11.5–15.5)
WBC: 5.9 10*3/uL (ref 4.0–10.5)

## 2018-04-29 MED ORDER — DESOGESTREL-ETHINYL ESTRADIOL 0.15-0.02/0.01 MG (21/5) PO TABS: 1.0000 | ORAL_TABLET | Freq: Every day | ORAL | 3 refills | Status: DC

## 2018-04-29 NOTE — Assessment & Plan Note (Signed)
Likely normally physiological discharge but will send off wet prep with pap smear. The patient indicates understanding of these issues and agrees with the plan.

## 2018-04-29 NOTE — Patient Instructions (Signed)
Great to see you. I will call you with your lab results from today and you can view them online.   

## 2018-04-29 NOTE — Assessment & Plan Note (Signed)
Reviewed preventive care protocols, scheduled due services, and updated immunizations Discussed nutrition, exercise, diet, and healthy lifestyle.  Discussed USPSTF recommendations of cervical cancer screening.  She is aware that interval of 3 years is recommended but pt would prefer to have pap smear done today.  

## 2018-04-29 NOTE — Assessment & Plan Note (Signed)
STD screening done today.

## 2018-04-29 NOTE — Progress Notes (Signed)
Subjective:   Patient ID: Heidi Paul, female    DOB: 1985-11-21, 32 y.o.   MRN: [id number]  Heidi Paul is a pleasant 32 y.o. year old female who presents to clinic today with Annual Exam (Patient is here today for a CPE with PAP. She would like STD screening and is experiencing vaginal discharge.  She is currently fasting.  She agrees to get her Tdap today.)  on 04/29/2018  HPI:  Last pap smear 04/28/17- done by me and was normal. 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   Since that time, pap smears have been normal.  On OCPs.  No spotting or issues with current OCP.  She does have intermittent vaginal discharge and is requesting STD testing.    Lab Results  Component Value Date   WBC 6.5 04/28/2017   HGB 14.2 04/28/2017   HCT 43.1 04/28/2017   MCV 90.6 04/28/2017   PLT 347.0 04/28/2017   Lab Results  Component Value Date   CHOL 267 (H) 04/28/2017   HDL 81.30 04/28/2017   LDLCALC 157 (H) 04/28/2017   LDLDIRECT 158.4 09/21/2013   TRIG 145.0 04/28/2017   CHOLHDL 3 04/28/2017   Lab Results  Component Value Date   CREATININE 0.83 04/28/2017   Lab Results  Component Value Date   TSH 1.22 04/28/2017   Lab Results  Component Value Date   ALT 11 04/28/2017   AST 15 04/28/2017   ALKPHOS 24 (L) 04/28/2017   BILITOT 0.3 04/28/2017   No current outpatient medications on file prior to visit.   No current facility-administered medications on file prior to visit.     Allergies  Allergen Reactions  . Biaxin [Clarithromycin] Hives and Swelling    Past Medical History:  Diagnosis Date  . Abnormal Pap smear of cervix    x 3 , show positive hpv  . Headache(784.0)     every other day.    Past Surgical History:  Procedure Laterality Date  . COLPOSCOPY VULVA  2012 and 2011   x2 /positive hpv  . COLPOSCOPY VULVA W/ BIOPSY  2011   abnormal pap/ hpv  . WISDOM TOOTH EXTRACTION     x4    Family History  Problem Relation Age of Onset  . Diabetes Maternal  Grandmother   . Hyperlipidemia Maternal Grandmother   . Hypertension Maternal Grandmother   . Cancer Father        prostate  . Hyperlipidemia Father   . Cancer Paternal Grandfather        prostate  . Hyperlipidemia Paternal Grandfather   . Hypertension Paternal Grandfather     Social History   Socioeconomic History  . Marital status: Single    Spouse name: Not on file  . Number of children: Not on file  . Years of education: Not on file  . Highest education level: Not on file  Occupational History  . Not on file  Social Needs  . Financial resource strain: Not on file  . Food insecurity:    Worry: Not on file    Inability: Not on file  . Transportation needs:    Medical: Not on file    Non-medical: Not on file  Tobacco Use  . Smoking status: Never Smoker  . Smokeless tobacco: Never Used  Substance and Sexual Activity  . Alcohol use: Yes    Comment: occasion  . Drug use: Not on file  . Sexual activity: Yes    Partners: Male  Birth control/protection: Pill  Lifestyle  . Physical activity:    Days per week: Not on file    Minutes per session: Not on file  . Stress: Not on file  Relationships  . Social connections:    Talks on phone: Not on file    Gets together: Not on file    Attends religious service: Not on file    Active member of club or organization: Not on file    Attends meetings of clubs or organizations: Not on file    Relationship status: Not on file  . Intimate partner violence:    Fear of current or ex partner: Not on file    Emotionally abused: Not on file    Physically abused: Not on file    Forced sexual activity: Not on file  Other Topics Concern  . 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     Review of Systems  Constitutional: Negative.   HENT: Negative.   Eyes: Negative.   Respiratory: Negative.   Cardiovascular: Negative.   Gastrointestinal: Negative.   Endocrine: Negative.   Genitourinary:  Positive for vaginal discharge. Negative for decreased urine volume, difficulty urinating, dyspareunia, dysuria, enuresis, flank pain, frequency, genital sores, hematuria, menstrual problem, pelvic pain and urgency.  Musculoskeletal: Negative.   Skin: Negative.   Allergic/Immunologic: Negative.   Neurological: Negative.   Hematological: Negative.   Psychiatric/Behavioral: Negative.   All other systems reviewed and are negative.      Objective:    BP (!) 130/102 (BP Location: Left Arm, Cuff Size: Normal)   Pulse 96   Temp 98.7 F (37.1 C) (Oral)   Ht 5' 1.75" (1.568 m)   Wt 199 lb (90.3 kg)   LMP 04/07/2018 (Exact Date)   SpO2 99%   BMI 36.69 kg/m   BP Readings from Last 3 Encounters:  04/29/18 (!) 130/102  04/28/17 126/78  02/22/16 122/84    Physical Exam   General:  Well-developed,well-nourished,in no acute distress; alert,appropriate and cooperative throughout examination Head:  normocephalic and atraumatic.   Eyes:  vision grossly intact, PERRL Ears:  R ear normal and L ear normal externally, TMs clear bilaterally Nose:  no external deformity.   Mouth:  good dentition.   Neck:  No deformities, masses, or tenderness noted. Breasts:  No mass, nodules, thickening, tenderness, bulging, retraction, inflamation, nipple discharge or skin changes noted.   Lungs:  Normal respiratory effort, chest expands symmetrically. Lungs are clear to auscultation, no crackles or wheezes. Heart:  Normal rate and regular rhythm. S1 and S2 normal without gallop, murmur, click, rub or other extra sounds. Abdomen:  Bowel sounds positive,abdomen soft and non-tender without masses, organomegaly or hernias noted. Rectal:  no external abnormalities.   Genitalia:  Pelvic Exam:        External: normal female genitalia without lesions or masses        Vagina: normal without lesions or masses        Cervix: normal without lesions or masses        Adnexa: normal bimanual exam without masses or  fullness        Uterus: normal by palpation        Pap smear: performed Msk:  No deformity or scoliosis noted of thoracic or lumbar spine.   Extremities:  No clubbing, cyanosis, edema, or deformity noted with normal full range of motion of all joints.   Neurologic:  alert & oriented X3 and gait normal.  Skin:  Intact without suspicious lesions or rashes Cervical Nodes:  No lymphadenopathy noted Axillary Nodes:  No palpable lymphadenopathy Psych:  Cognition and judgment appear intact. Alert and cooperative with normal attention span and concentration. No apparent delusions, illusions, hallucinations      Assessment & Plan:   Well woman exam with routine gynecological exam - Plan: Cytology - PAP, CBC with Differential/Platelet, Comprehensive metabolic panel, Lipid panel, TSH  Need for Tdap vaccination - Plan: Tdap vaccine greater than or equal to 7yo IM  Vaginal discharge - Plan: Cytology - PAP  Screening for STD (sexually transmitted disease) - Plan: HIV antibody (with reflex), RPR No follow-ups on file.

## 2018-04-30 ENCOUNTER — Telehealth: Payer: Self-pay | Admitting: Family Medicine

## 2018-04-30 LAB — HIV ANTIBODY (ROUTINE TESTING W REFLEX): HIV 1&2 Ab, 4th Generation: NONREACTIVE

## 2018-04-30 LAB — RPR: RPR Ser Ql: NONREACTIVE

## 2018-04-30 NOTE — Telephone Encounter (Signed)
Copied from [place] [id number]. Topic: Quick Communication - Lab Results >> Apr 30, 2018 11:24 AM Doy Hutching, LPN wrote: Called patient to inform them of 6.20.19 lab results. When patient returns call, triage nurse may disclose results.  Patient is calling back to get her results.CB# [phone]

## 2018-05-01 NOTE — Telephone Encounter (Signed)
Patient notified

## 2018-05-04 LAB — CYTOLOGY - PAP
Adequacy: ABSENT
Bacterial vaginitis: NEGATIVE
CANDIDA VAGINITIS: NEGATIVE
CHLAMYDIA, DNA PROBE: NEGATIVE
Diagnosis: NEGATIVE
HPV (WINDOPATH): DETECTED — AB
HPV 16/18/45 genotyping: NEGATIVE
NEISSERIA GONORRHEA: NEGATIVE
TRICH (WINDOWPATH): NEGATIVE

## 2018-05-04 LAB — CERVICOVAGINAL ANCILLARY ONLY: Herpes: NEGATIVE

## 2018-10-13 ENCOUNTER — Telehealth: Payer: Self-pay

## 2018-10-13 ENCOUNTER — Telehealth: Payer: Self-pay | Admitting: Radiology

## 2018-10-13 DIAGNOSIS — Z111 Encounter for screening for respiratory tuberculosis: Secondary | ICD-10-CM

## 2018-10-13 NOTE — Telephone Encounter (Signed)
Copied from [place] [id number]. Topic: General - Other >> Oct 13, 2018  3:17 PM Alanda Slim E wrote: Reason for CRM:  pt has an order for labs and asked for a location closer to her due to not being able to make it to [place] office in time. FC approved pt having labs at [facility]. Pt lab appt was scheduled for 12.10.2019 @ 4pm. Pt will be faxing over paperwork that needs to be filled out when she arrives   [facility] for patient to come here for blood draw, all paperwork needs to be done by provider/CMA at her PCP office.

## 2018-10-13 NOTE — Telephone Encounter (Signed)
I spoke with pt about work form/she is going to call back to schedule a lab visit for a [place] level to be drawn/plz schedule when she RTC/future order in system/thx dmf  Form in file bin on left side of my desk/thx dmf

## 2018-10-14 NOTE — Telephone Encounter (Signed)
Noted/thx dmf 

## 2018-10-20 ENCOUNTER — Other Ambulatory Visit (INDEPENDENT_AMBULATORY_CARE_PROVIDER_SITE_OTHER): Payer: BC Managed Care – PPO

## 2018-10-20 DIAGNOSIS — Z111 Encounter for screening for respiratory tuberculosis: Secondary | ICD-10-CM

## 2018-10-23 LAB — QUANTIFERON-TB GOLD PLUS
NIL: 0.02 IU/mL
QuantiFERON-TB Gold Plus: NEGATIVE
TB1-NIL: 0 IU/mL
TB2-NIL: 0.03 IU/mL

## 2018-10-26 ENCOUNTER — Telehealth: Payer: Self-pay

## 2018-10-26 NOTE — Telephone Encounter (Signed)
Copied from [place] [id number]. Topic: Quick Communication - See Telephone Encounter >> Oct 26, 2018 10:18 AM Marja Kays F wrote: Pt is calling to get the results of the blood tb skin test that was done on 10/20/18  Best number [phone]

## 2018-10-27 NOTE — Telephone Encounter (Signed)
Please advise 

## 2018-10-27 NOTE — Telephone Encounter (Signed)
Pt requesting her result to also be in her mychart for the tb blood draw.

## 2019-04-21 ENCOUNTER — Encounter: Payer: Self-pay | Admitting: Family Medicine

## 2019-04-30 DIAGNOSIS — E785 Hyperlipidemia, unspecified: Secondary | ICD-10-CM | POA: Insufficient documentation

## 2019-04-30 NOTE — Progress Notes (Signed)
Subjective:   Patient ID: Heidi Paul, female    DOB: 1986/01/12, 33 y.o.   MRN: [id number]  Heidi Paul is a pleasant 33 y.o. year old female who presents to clinic today with Annual Exam (Pt screened at vehicle. Patient is here today for a CPE with PAP.  She is UTD on immunizations. She is currently fasting.)  on 05/03/2019  HPI:  Health Maintenance  Topic Date Due  . INFLUENZA VACCINE  06/12/2019  . PAP SMEAR-Modifier  04/29/2021  . TETANUS/TDAP  04/29/2028  . HIV Screening  Completed    Last pap smear 04/29/18- done by me and was positive for HPV, non high risk strains with negative cytology. 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     On OCPs Minerva Fester).  No spotting or issues with current OCP.  Has not been sexually active in 10 months.  She does have intermittent vaginal discharge and is requesting STD testing.    Lab Results  Component Value Date   WBC 5.9 04/29/2018   HGB 14.2 04/29/2018   HCT 42.3 04/29/2018   MCV 90.7 04/29/2018   PLT 378.0 04/29/2018   HLD- very elevated last year but she wanted to try diet and exercise first before starting medications.  Has been working on diet.  According to her home scale, she has lost 8 pounds. Lab Results  Component Value Date   CHOL 301 (H) 04/29/2018   HDL 100.10 04/29/2018   LDLCALC 178 (H) 04/29/2018   LDLDIRECT 158.4 09/21/2013   TRIG 112.0 04/29/2018   CHOLHDL 3 04/29/2018     Lab Results  Component Value Date   CREATININE 0.85 04/29/2018   Lab Results  Component Value Date   TSH 1.81 04/29/2018   Lab Results  Component Value Date   ALT 9 04/29/2018   AST 10 04/29/2018   ALKPHOS 27 (L) 04/29/2018   BILITOT 0.3 04/29/2018   No current outpatient medications on file prior to visit.   No current facility-administered medications on file prior to visit.     Allergies  Allergen Reactions  . Biaxin [Clarithromycin] Hives and Swelling    Past Medical History:  Diagnosis Date  . Abnormal  Pap smear of cervix    x 3 , show positive hpv  . Headache(784.0)     every other day.    Past Surgical History:  Procedure Laterality Date  . COLPOSCOPY VULVA  2012 and 2011   x2 /positive hpv  . COLPOSCOPY VULVA W/ BIOPSY  2011   abnormal pap/ hpv  . WISDOM TOOTH EXTRACTION     x4    Family History  Problem Relation Age of Onset  . Diabetes Maternal Grandmother   . Hyperlipidemia Maternal Grandmother   . Hypertension Maternal Grandmother   . Cancer Father        prostate  . Hyperlipidemia Father   . Cancer Paternal Grandfather        prostate  . Hyperlipidemia Paternal Grandfather   . Hypertension Paternal Grandfather     Social History   Socioeconomic History  . Marital status: Single    Spouse name: Not on file  . Number of children: Not on file  . Years of education: Not on file  . Highest education level: Not on file  Occupational History  . Not on file  Social Needs  . Financial resource strain: Not on file  . Food insecurity    Worry: Not on file  Inability: Not on file  . Transportation needs    Medical: Not on file    Non-medical: Not on file  Tobacco Use  . Smoking status: Never Smoker  . Smokeless tobacco: Never Used  Substance and Sexual Activity  . Alcohol use: Yes    Comment: occasion  . Drug use: Not on file  . Sexual activity: Yes    Partners: Male    Birth control/protection: Pill  Lifestyle  . Physical activity    Days per week: Not on file    Minutes per session: Not on file  . Stress: Not on file  Relationships  . Social Herbalist on phone: Not on file    Gets together: Not on file    Attends religious service: Not on file    Active member of club or organization: Not on file    Attends meetings of clubs or organizations: Not on file    Relationship status: Not on file  . Intimate partner violence    Fear of current or ex partner: Not on file    Emotionally abused: Not on file    Physically abused: Not on file     Forced sexual activity: Not on file  Other Topics Concern  . 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     Review of Systems  Constitutional: Negative.   HENT: Negative.   Eyes: Negative.   Respiratory: Negative.   Cardiovascular: Negative.   Gastrointestinal: Negative.   Endocrine: Negative.   Genitourinary: Positive for vaginal discharge. Negative for decreased urine volume, difficulty urinating, dyspareunia, dysuria, enuresis, flank pain, frequency, genital sores, hematuria, menstrual problem, pelvic pain and urgency.  Musculoskeletal: Negative.   Skin: Negative.   Allergic/Immunologic: Negative.   Neurological: Negative.   Hematological: Negative.   Psychiatric/Behavioral: Negative.   All other systems reviewed and are negative.      Objective:    BP 122/86 (BP Location: Left Arm, Patient Position: Sitting, Cuff Size: Normal)   Pulse 99   Temp 98.4 F (36.9 C) (Oral)   Ht 5' 1.5" (1.562 m)   Wt 188 lb 12.8 oz (85.6 kg)   LMP 04/29/2019   SpO2 99%   BMI 35.10 kg/m   Wt Readings from Last 3 Encounters:  05/03/19 188 lb 12.8 oz (85.6 kg)  04/29/18 199 lb (90.3 kg)  04/28/17 201 lb 8 oz (91.4 kg)    BP Readings from Last 3 Encounters:  05/03/19 122/86  04/29/18 (!) 130/102  04/28/17 126/78    Physical Exam    General:  Well-developed,well-nourished,in no acute distress; alert,appropriate and cooperative throughout examination Head:  normocephalic and atraumatic.   Eyes:  vision grossly intact, PERRL Ears:  R ear normal and L ear normal externally, TMs clear bilaterally Nose:  no external deformity.   Mouth:  good dentition.   Neck:  No deformities, masses, or tenderness noted. Breasts:  No mass, nodules, thickening, tenderness, bulging, retraction, inflamation, nipple discharge. Confluent rash under bilateral breasts- notices is more during the summer time. Lungs:  Normal respiratory effort, chest expands symmetrically.  Lungs are clear to auscultation, no crackles or wheezes. Heart:  Normal rate and regular rhythm. S1 and S2 normal without gallop, murmur, click, rub or other extra sounds. Abdomen:  Bowel sounds positive,abdomen soft and non-tender without masses, organomegaly or hernias noted. Rectal:  no external abnormalities.   Genitalia:  Pelvic Exam:        External:  normal female genitalia without lesions or masses        Vagina: normal without lesions or masses        Cervix: normal without lesions or masses        Adnexa: normal bimanual exam without masses or fullness        Uterus: normal by palpation        Pap smear: performed Msk:  No deformity or scoliosis noted of thoracic or lumbar spine.   Extremities:  No clubbing, cyanosis, edema, or deformity noted with normal full range of motion of all joints.   Neurologic:  alert & oriented X3 and gait normal.   Skin:  Intact without suspicious lesions or rashes Cervical Nodes:  No lymphadenopathy noted Axillary Nodes:  No palpable lymphadenopathy Psych:  Cognition and judgment appear intact. Alert and cooperative with normal attention span and concentration. No apparent delusions, illusions, hallucinations       Assessment & Plan:   Well woman exam with routine gynecological exam - Plan:   Hyperlipidemia, unspecified hyperlipidemia type - Plan: Lipid panel, Comprehensive metabolic panel, TSH, CBC with Differential/Platelet,  No follow-ups on file.

## 2019-05-03 ENCOUNTER — Encounter: Payer: Self-pay | Admitting: Family Medicine

## 2019-05-03 ENCOUNTER — Ambulatory Visit (INDEPENDENT_AMBULATORY_CARE_PROVIDER_SITE_OTHER): Payer: BC Managed Care – PPO | Admitting: Family Medicine

## 2019-05-03 ENCOUNTER — Other Ambulatory Visit (HOSPITAL_COMMUNITY)
Admission: RE | Admit: 2019-05-03 | Discharge: 2019-05-03 | Disposition: A | Discharge status: Home / Self Care | Payer: BC Managed Care – PPO | Source: Ambulatory Visit | Attending: Family Medicine | Admitting: Family Medicine

## 2019-05-03 VITALS — BP 122/86 | HR 99 | Temp 98.4°F | Ht 61.5 in | Wt 188.8 lb

## 2019-05-03 DIAGNOSIS — L304 Erythema intertrigo: Secondary | ICD-10-CM

## 2019-05-03 DIAGNOSIS — Z01419 Encounter for gynecological examination (general) (routine) without abnormal findings: Secondary | ICD-10-CM | POA: Insufficient documentation

## 2019-05-03 DIAGNOSIS — E785 Hyperlipidemia, unspecified: Secondary | ICD-10-CM | POA: Diagnosis not present

## 2019-05-03 DIAGNOSIS — Z113 Encounter for screening for infections with a predominantly sexual mode of transmission: Secondary | ICD-10-CM | POA: Diagnosis not present

## 2019-05-03 LAB — CBC WITH DIFFERENTIAL/PLATELET
Basophils Absolute: 0.1 10*3/uL (ref 0.0–0.1)
Basophils Relative: 1.1 % (ref 0.0–3.0)
Eosinophils Absolute: 0.1 10*3/uL (ref 0.0–0.7)
Eosinophils Relative: 1.1 % (ref 0.0–5.0)
HCT: 41.9 % (ref 36.0–46.0)
Hemoglobin: 13.8 g/dL (ref 12.0–15.0)
Lymphocytes Relative: 53.9 % — ABNORMAL HIGH (ref 12.0–46.0)
Lymphs Abs: 2.9 10*3/uL (ref 0.7–4.0)
MCHC: 32.9 g/dL (ref 30.0–36.0)
MCV: 92.7 fl (ref 78.0–100.0)
Monocytes Absolute: 0.3 10*3/uL (ref 0.1–1.0)
Monocytes Relative: 5.9 % (ref 3.0–12.0)
Neutro Abs: 2.1 10*3/uL (ref 1.4–7.7)
Neutrophils Relative %: 38 % — ABNORMAL LOW (ref 43.0–77.0)
Platelets: 320 10*3/uL (ref 150.0–400.0)
RBC: 4.52 Mil/uL (ref 3.87–5.11)
RDW: 13.2 % (ref 11.5–15.5)
WBC: 5.4 10*3/uL (ref 4.0–10.5)

## 2019-05-03 LAB — LIPID PANEL
Cholesterol: 278 mg/dL — ABNORMAL HIGH (ref 0–200)
HDL: 87.1 mg/dL (ref >39.00)
LDL Cholesterol: 171 mg/dL — ABNORMAL HIGH (ref 0–99)
NonHDL: 191.38
Total CHOL/HDL Ratio: 3
Triglycerides: 102 mg/dL (ref 0.0–149.0)
VLDL: 20.4 mg/dL (ref 0.0–40.0)

## 2019-05-03 LAB — COMPREHENSIVE METABOLIC PANEL
ALT: 10 U/L (ref 0–35)
AST: 13 U/L (ref 0–37)
Albumin: 4.2 g/dL (ref 3.5–5.2)
Alkaline Phosphatase: 24 U/L — ABNORMAL LOW (ref 39–117)
BUN: 10 mg/dL (ref 6–23)
CO2: 24 mEq/L (ref 19–32)
Calcium: 9.2 mg/dL (ref 8.4–10.5)
Chloride: 105 mEq/L (ref 96–112)
Creatinine, Ser: 0.86 mg/dL (ref 0.40–1.20)
GFR: 92.29 mL/min (ref >60.00)
Glucose, Bld: 93 mg/dL (ref 70–99)
Potassium: 4.2 mEq/L (ref 3.5–5.1)
Sodium: 138 mEq/L (ref 135–145)
Total Bilirubin: 0.3 mg/dL (ref 0.2–1.2)
Total Protein: 6.6 g/dL (ref 6.0–8.3)

## 2019-05-03 LAB — TSH: TSH: 1.33 u[IU]/mL (ref 0.35–4.50)

## 2019-05-03 MED ORDER — DESOGESTREL-ETHINYL ESTRADIOL 0.15-0.02/0.01 MG (21/5) PO TABS
1.0000 | ORAL_TABLET | Freq: Every day | ORAL | 3 refills | Status: DC
Start: 2019-05-03 — End: 2020-05-31

## 2019-05-03 MED ORDER — NYSTATIN 100000 UNIT/GM EX POWD: Freq: Four times a day (QID) | CUTANEOUS | 0 refills | Status: DC

## 2019-05-03 NOTE — Assessment & Plan Note (Signed)
Has been working on diet.  Due for labs today.

## 2019-05-03 NOTE — Assessment & Plan Note (Signed)
New- beneath her breasts. Advised to keep the area dry and use topical nystatin powder. Call or send my chart message prn if these symptoms worsen or fail to improve as anticipated. The patient indicates understanding of these issues and agrees with the plan.

## 2019-05-03 NOTE — Assessment & Plan Note (Signed)
Screening today 

## 2019-05-03 NOTE — Assessment & Plan Note (Signed)
Reviewed preventive care protocols, scheduled due services, and updated immunizations Discussed nutrition, exercise, diet, and healthy lifestyle.  Discussed USPSTF recommendations of cervical cancer screening.  She is aware that interval of 3 years is recommended but pt would prefer to have pap smear done today which is reasonable as she was HPV positive on last years pap (not high risk).

## 2019-05-03 NOTE — Patient Instructions (Signed)
Great to see you. I will call you with your lab results from today and you can view them online.   

## 2019-05-04 ENCOUNTER — Encounter: Payer: Self-pay | Admitting: Family Medicine

## 2019-05-04 LAB — CYTOLOGY - PAP
Adequacy: ABSENT
Chlamydia: NEGATIVE
Diagnosis: NEGATIVE
HPV: NOT DETECTED
Neisseria Gonorrhea: NEGATIVE
Trichomonas: NEGATIVE

## 2019-05-06 LAB — CERVICOVAGINAL ANCILLARY ONLY: Herpes: NEGATIVE

## 2019-05-11 ENCOUNTER — Other Ambulatory Visit: Payer: Self-pay | Admitting: Family Medicine

## 2019-05-11 MED ORDER — ROSUVASTATIN CALCIUM 5 MG PO TABS: 5.0000 mg | ORAL_TABLET | Freq: Every day | ORAL | 3 refills | Status: DC

## 2019-07-07 ENCOUNTER — Other Ambulatory Visit: Payer: Self-pay | Admitting: Family Medicine

## 2019-07-08 MED ORDER — NYSTATIN 100000 UNIT/GM EX POWD: Freq: Four times a day (QID) | CUTANEOUS | 0 refills | Status: DC

## 2019-11-18 ENCOUNTER — Other Ambulatory Visit: Payer: Self-pay | Admitting: Family Medicine

## 2019-11-18 MED ORDER — NYSTATIN 100000 UNIT/GM EX POWD: Freq: Four times a day (QID) | CUTANEOUS | 0 refills | Status: AC

## 2019-11-18 NOTE — Telephone Encounter (Signed)
Last OV 05/03/19 Last fill 07/08/19  #15g/0

## 2020-05-29 ENCOUNTER — Telehealth: Payer: Self-pay | Admitting: Family Medicine

## 2020-05-29 NOTE — Telephone Encounter (Signed)
Patient called and requesting a refill for Kariva sent to [facility] in [place], please advise. CB is [phone]

## 2020-05-30 NOTE — Telephone Encounter (Signed)
Ok to send 90tabs, no refill Maintain upcoming appt for additional refills

## 2020-05-30 NOTE — Telephone Encounter (Signed)
Last OV 05/03/19 Last fill 05/03/19  #3/3 Next OV 06/16/20

## 2020-05-31 ENCOUNTER — Other Ambulatory Visit: Payer: Self-pay

## 2020-05-31 MED ORDER — DESOGESTREL-ETHINYL ESTRADIOL 0.15-0.02/0.01 MG (21/5) PO TABS: 1.0000 | ORAL_TABLET | Freq: Every day | ORAL | 0 refills | Status: DC

## 2020-05-31 NOTE — Telephone Encounter (Signed)
Rx sent out today.

## 2020-06-16 ENCOUNTER — Encounter: Payer: BC Managed Care – PPO | Admitting: Nurse Practitioner

## 2020-07-10 ENCOUNTER — Telehealth: Payer: Self-pay | Admitting: General Practice

## 2020-07-10 NOTE — Telephone Encounter (Signed)
Received RX refill request from [facility] for rosuvastatin. Patient has previous cancelled TOC appt to [facility]. Per Alvy Beal., called patient to reschedule TOC appt before RX can be refilled. Patient stated she would call back to schedule.

## 2020-08-15 ENCOUNTER — Other Ambulatory Visit: Payer: Self-pay | Admitting: Nurse Practitioner

## 2020-09-05 ENCOUNTER — Other Ambulatory Visit: Payer: Self-pay | Admitting: Nurse Practitioner

## 2020-09-08 ENCOUNTER — Encounter: Payer: Self-pay | Admitting: Nurse Practitioner

## 2020-09-08 ENCOUNTER — Other Ambulatory Visit: Payer: Self-pay

## 2020-09-08 ENCOUNTER — Ambulatory Visit (INDEPENDENT_AMBULATORY_CARE_PROVIDER_SITE_OTHER): Payer: BC Managed Care – PPO | Admitting: Nurse Practitioner

## 2020-09-08 VITALS — BP 140/90 | HR 100 | Temp 98.6°F | Ht 61.25 in | Wt 192.2 lb

## 2020-09-08 DIAGNOSIS — N920 Excessive and frequent menstruation with regular cycle: Secondary | ICD-10-CM | POA: Insufficient documentation

## 2020-09-08 DIAGNOSIS — R1900 Intra-abdominal and pelvic swelling, mass and lump, unspecified site: Secondary | ICD-10-CM | POA: Diagnosis not present

## 2020-09-08 DIAGNOSIS — E782 Mixed hyperlipidemia: Secondary | ICD-10-CM

## 2020-09-08 DIAGNOSIS — Z113 Encounter for screening for infections with a predominantly sexual mode of transmission: Secondary | ICD-10-CM | POA: Diagnosis not present

## 2020-09-08 DIAGNOSIS — N871 Moderate cervical dysplasia: Secondary | ICD-10-CM | POA: Diagnosis not present

## 2020-09-08 DIAGNOSIS — Z0001 Encounter for general adult medical examination with abnormal findings: Secondary | ICD-10-CM | POA: Diagnosis not present

## 2020-09-08 LAB — CBC WITH DIFFERENTIAL/PLATELET
Basophils Absolute: 0.1 10*3/uL (ref 0.0–0.1)
Basophils Relative: 0.8 % (ref 0.0–3.0)
Eosinophils Absolute: 0 10*3/uL (ref 0.0–0.7)
Eosinophils Relative: 0.5 % (ref 0.0–5.0)
HCT: 42.2 % (ref 36.0–46.0)
Hemoglobin: 13.9 g/dL (ref 12.0–15.0)
Lymphocytes Relative: 49.7 % — ABNORMAL HIGH (ref 12.0–46.0)
Lymphs Abs: 3.3 10*3/uL (ref 0.7–4.0)
MCHC: 32.9 g/dL (ref 30.0–36.0)
MCV: 92.2 fl (ref 78.0–100.0)
Monocytes Absolute: 0.4 10*3/uL (ref 0.1–1.0)
Monocytes Relative: 6.7 % (ref 3.0–12.0)
Neutro Abs: 2.8 10*3/uL (ref 1.4–7.7)
Neutrophils Relative %: 42.3 % — ABNORMAL LOW (ref 43.0–77.0)
Platelets: 323 10*3/uL (ref 150.0–400.0)
RBC: 4.58 Mil/uL (ref 3.87–5.11)
RDW: 13.2 % (ref 11.5–15.5)
WBC: 6.7 10*3/uL (ref 4.0–10.5)

## 2020-09-08 LAB — COMPREHENSIVE METABOLIC PANEL
ALT: 11 U/L (ref 0–35)
AST: 15 U/L (ref 0–37)
Albumin: 4.3 g/dL (ref 3.5–5.2)
Alkaline Phosphatase: 22 U/L — ABNORMAL LOW (ref 39–117)
BUN: 12 mg/dL (ref 6–23)
CO2: 26 mEq/L (ref 19–32)
Calcium: 9.6 mg/dL (ref 8.4–10.5)
Chloride: 105 mEq/L (ref 96–112)
Creatinine, Ser: 0.8 mg/dL (ref 0.40–1.20)
GFR: 96.57 mL/min (ref >60.00)
Glucose, Bld: 86 mg/dL (ref 70–99)
Potassium: 4.2 mEq/L (ref 3.5–5.1)
Sodium: 139 mEq/L (ref 135–145)
Total Bilirubin: 0.4 mg/dL (ref 0.2–1.2)
Total Protein: 7.1 g/dL (ref 6.0–8.3)

## 2020-09-08 LAB — LIPID PANEL
Cholesterol: 287 mg/dL — ABNORMAL HIGH (ref 0–200)
HDL: 100.8 mg/dL (ref >39.00)
LDL Cholesterol: 168 mg/dL — ABNORMAL HIGH (ref 0–99)
NonHDL: 186.4
Total CHOL/HDL Ratio: 3
Triglycerides: 92 mg/dL (ref 0.0–149.0)
VLDL: 18.4 mg/dL (ref 0.0–40.0)

## 2020-09-08 LAB — TSH: TSH: 1.07 u[IU]/mL (ref 0.35–4.50)

## 2020-09-08 NOTE — Assessment & Plan Note (Addendum)
S/p LEEP 2/21/20214: HIGH GRADE SQUAMOUS INTRAEPITHELIAL LESION, CIN-II (MODERATE DYSPLASIA), WITH EXTENSION INTO ENDOCERVICAL GLANDS. THE SURGICAL RESECTION MARGINS ARE NEGATIVE FOR HIGH GRADE SQUAMOUS DYSPLASIA. Normal PAP 09/2013, 2016, 2017, 2018, 2019 and 2020 Positive HPV in 2019, but negative genotype 16,18, and 45. She agreed to defer to every 27yrs at this time.

## 2020-09-08 NOTE — Patient Instructions (Addendum)
Go to lab for blood draw and urine collection.  Try miconazole (zeasorb-brand) powder for under breast.  You will be contacted to schedule appt for [place].  Preventive Care 33-33 Years Old, Female Preventive care refers to visits with your health care provider and lifestyle choices that can promote health and wellness. This includes:  A yearly physical exam. This may also be called an annual well check.  Regular dental visits and eye exams.  Immunizations.  Screening for certain conditions.  Healthy lifestyle choices, such as eating a healthy diet, getting regular exercise, not using drugs or products that contain nicotine and tobacco, and limiting alcohol use. What can I expect for my preventive care visit? Physical exam Your health care provider will check your:  Height and weight. This may be used to calculate body mass index (BMI), which tells if you are at a healthy weight.  Heart rate and blood pressure.  Skin for abnormal spots. Counseling Your health care provider may ask you questions about your:  Alcohol, tobacco, and drug use.  Emotional well-being.  Home and relationship well-being.  Sexual activity.  Eating habits.  Work and work Statistician.  Method of birth control.  Menstrual cycle.  Pregnancy history. What immunizations do I need?  Influenza (flu) vaccine  This is recommended every year. Tetanus, diphtheria, and pertussis (Tdap) vaccine  You may need a Td booster every 10 years. Varicella (chickenpox) vaccine  You may need this if you have not been vaccinated. Human papillomavirus (HPV) vaccine  If recommended by your health care provider, you may need three doses over 6 months. Measles, mumps, and rubella (MMR) vaccine  You may need at least one dose of MMR. You may also need a second dose. Meningococcal conjugate (MenACWY) vaccine  One dose is recommended if you are age 59-21 years and a first-year college student living in a residence  hall, or if you have one of several medical conditions. You may also need additional booster doses. Pneumococcal conjugate (PCV13) vaccine  You may need this if you have certain conditions and were not previously vaccinated. Pneumococcal polysaccharide (PPSV23) vaccine  You may need one or two doses if you smoke cigarettes or if you have certain conditions. Hepatitis A vaccine  You may need this if you have certain conditions or if you travel or work in places where you may be exposed to hepatitis A. Hepatitis B vaccine  You may need this if you have certain conditions or if you travel or work in places where you may be exposed to hepatitis B. Haemophilus influenzae type b (Hib) vaccine  You may need this if you have certain conditions. You may receive vaccines as individual doses or as more than one vaccine together in one shot (combination vaccines). Talk with your health care provider about the risks and benefits of combination vaccines. What tests do I need?  Blood tests  Lipid and cholesterol levels. These may be checked every 5 years starting at age 10.  Hepatitis C test.  Hepatitis B test. Screening  Diabetes screening. This is done by checking your blood sugar (glucose) after you have not eaten for a while (fasting).  Sexually transmitted disease (STD) testing.  BRCA-related cancer screening. This may be done if you have a family history of breast, ovarian, tubal, or peritoneal cancers.  Pelvic exam and Pap test. This may be done every 3 years starting at age 10. Starting at age 73, this may be done every 5 years if you have a  Pap test in combination with an HPV test. Talk with your health care provider about your test results, treatment options, and if necessary, the need for more tests. Follow these instructions at home: Eating and drinking   Eat a diet that includes fresh fruits and vegetables, whole grains, lean protein, and low-fat dairy.  Take vitamin and  mineral supplements as recommended by your health care provider.  Do not drink alcohol if: ? Your health care provider tells you not to drink. ? You are pregnant, may be pregnant, or are planning to become pregnant.  If you drink alcohol: ? Limit how much you have to 0-1 drink a day. ? Be aware of how much alcohol is in your drink. In the [place] one drink equals one 12 oz bottle of beer (355 mL), one 5 oz glass of wine (148 mL), or one 1 oz glass of hard liquor (44 mL). Lifestyle  Take daily care of your teeth and gums.  Stay active. Exercise for at least 30 minutes on 5 or more days each week.  Do not use any products that contain nicotine or tobacco, such as cigarettes, e-cigarettes, and chewing tobacco. If you need help quitting, ask your health care provider.  If you are sexually active, practice safe sex. Use a condom or other form of birth control (contraception) in order to prevent pregnancy and STIs (sexually transmitted infections). If you plan to become pregnant, see your health care provider for a preconception visit. What's next?  Visit your health care provider once a year for a well check visit.  Ask your health care provider how often you should have your eyes and teeth checked.  Stay up to date on all vaccines. This information is not intended to replace advice given to you by your health care provider. Make sure you discuss any questions you have with your health care provider. Document Revised: 07/09/2018 Document Reviewed: 07/09/2018 Elsevier Patient Education  2020 [employer].

## 2020-09-08 NOTE — Assessment & Plan Note (Addendum)
Reports heavy vaginal bleeding, clots and dysmenorrhea despite use of OCP for several months. chnaged from 3days to 7days every month Sexually active: use of OCP and condoms.  Ordered pelvic and transvaginal US, cbc and iron panel

## 2020-09-08 NOTE — Progress Notes (Signed)
Subjective:    Patient ID: Heidi Paul, female    DOB: [birth year], 34 y.o.   MRN: [id number]  Patient presents today for CPE and eval of chronic conditions.  HPI Dysplasia of cervix, high grade CIN 2 S/p LEEP 2/21/20214: HIGH GRADE SQUAMOUS INTRAEPITHELIAL LESION, CIN-II (MODERATE DYSPLASIA), WITH EXTENSION INTO ENDOCERVICAL GLANDS. THE SURGICAL RESECTION MARGINS ARE NEGATIVE FOR HIGH GRADE SQUAMOUS DYSPLASIA. Normal PAP 09/2013, 2016, 2017, 2018, 2019 and 2020 Positive HPV in 2019, but negative genotype 16,18, and 45. She agreed to defer to every 38yrs at this time.  Menorrhagia with regular cycle Reports heavy vaginal bleeding, clots and dysmenorrhea despite use of OCP for several months. chnaged from 3days to 7days every month Sexually active: use of OCP and condoms.  Ordered pelvic and transvaginal US, cbc and iron panel   HLD (hyperlipidemia) No improvement in lipid panel with crestor 5mg . Lipid Panel     Component Value Date/Time   CHOL 287 (H) 09/08/2020 1207   TRIG 92.0 09/08/2020 1207   HDL 100.80 09/08/2020 1207   CHOLHDL 3 09/08/2020 1207   VLDL 18.4 09/08/2020 1207   LDLCALC 168 (H) 09/08/2020 1207   LDLDIRECT 158.4 09/21/2013 1455   increase crestor to 10mg . Heart healthy diet and regular exercise is also necessary. Repeat lab in 31months (fasting). Order entered    Sexual History (orientation,birth control, marital status, STD):sexually active, use of condoms and OCP  Depression/Suicide: Depression screen [facility] 2/9 09/08/2020 05/03/2019 04/29/2018  Decreased Interest 0 0 0  Down, Depressed, Hopeless 0 0 0  PHQ - 2 Score 0 0 0   Vision:up to date  Dental:up to date  Immunizations: (TDAP, Hep C screen, Pneumovax, Influenza, zoster)  Health Maintenance  Topic Date Due  . Flu Shot  02/08/2021*  . Pap Smear  05/02/2022  . Tetanus Vaccine  04/29/2028  . COVID-19 Vaccine  Completed  .  Hepatitis C: One time screening is recommended by Center for Disease  Control  (CDC) for  adults born from [birth year] through 1964.   Completed  . HIV Screening  Completed  *Topic was postponed. The date shown is not the original due date.   Diet:regular.  Weight:  Wt Readings from Last 3 Encounters:  09/08/20 192 lb 3.2 oz (87.2 kg)  05/03/19 188 lb 12.8 oz (85.6 kg)  04/29/18 199 lb (90.3 kg)    Fall Risk: Fall Risk  09/08/2020 05/03/2019 04/29/2018  Falls in the past year? 0 0 No  Follow up - Falls evaluation completed -   Medications and allergies reviewed with patient and updated if appropriate.  Patient Active Problem List   Diagnosis Date Noted  . Menorrhagia with regular cycle 09/08/2020  . Intertrigo 05/03/2019  . HLD (hyperlipidemia) 04/30/2019  . Dysplasia of cervix, high grade CIN 2 11/30/2012    Current Outpatient Medications on File Prior to Visit  Medication Sig Dispense Refill  . KARIVA 0.15-0.02/0.01 MG (21/5) tablet TAKE 1 TABLET BY MOUTH DAILY. NO ADDITIONAL REFILLS WITHOUT AN APPOINTMENT 28 tablet 0  . nystatin (MYCOSTATIN/NYSTOP) powder Apply topically 4 (four) times daily. 15 g 0   No current facility-administered medications on file prior to visit.    Past Medical History:  Diagnosis Date  . Abnormal Pap smear of cervix    x 3 , show positive hpv  . Amenorrhea 03/20/2015  . CIN II (cervical intraepithelial neoplasia II) 01/01/2013   S/p LEEP 01/01/2013   . CIN II (cervical intraepithelial neoplasia II) 01/01/2013   S/p  LEEP 01/01/2013   . Headache(784.0)     every other day.  . Obesity (BMI 30-39.9) 09/21/2013    Past Surgical History:  Procedure Laterality Date  . COLPOSCOPY VULVA  2012 and 2011   x2 /positive hpv  . COLPOSCOPY VULVA W/ BIOPSY  2011   abnormal pap/ hpv  . WISDOM TOOTH EXTRACTION     x4    Social History   Socioeconomic History  . Marital status: Single    Spouse name: Not on file  . Number of children: 0  . Years of education: Not on file  . Highest education level: Not on file  Occupational  History  . Not on file  Tobacco Use  . Smoking status: Never Smoker  . Smokeless tobacco: Never Used  Substance and Sexual Activity  . Alcohol use: Yes    Comment: occasion  . Drug use: Never  . Sexual activity: Yes    Partners: Male    Birth control/protection: Pill, Condom  Other Topics Concern  . 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   Social Determinants of Health   Financial Resource Strain:   . Difficulty of Paying Living Expenses: Not on file  Food Insecurity:   . Worried About Charity fundraiser in the Last Year: Not on file  . Ran Out of Food in the Last Year: Not on file  Transportation Needs:   . Lack of Transportation (Medical): Not on file  . Lack of Transportation (Non-Medical): Not on file  Physical Activity:   . Days of Exercise per Week: Not on file  . Minutes of Exercise per Session: Not on file  Stress:   . Feeling of Stress : Not on file  Social Connections:   . Frequency of Communication with Friends and Family: Not on file  . Frequency of Social Gatherings with Friends and Family: Not on file  . Attends Religious Services: Not on file  . Active Member of Clubs or Organizations: Not on file  . Attends Archivist Meetings: Not on file  . Marital Status: Not on file    Family History  Problem Relation Age of Onset  . Diabetes Maternal Grandmother   . Hyperlipidemia Maternal Grandmother   . Hypertension Maternal Grandmother   . Cancer Father        prostate  . Hyperlipidemia Father   . Cancer Paternal Grandfather        prostate  . Hyperlipidemia Paternal Grandfather   . Hypertension Paternal Grandfather   . Lupus Paternal Aunt         Review of Systems  Constitutional: Negative for fever, malaise/fatigue and weight loss.  HENT: Negative for congestion and sore throat.   Eyes:       Negative for visual changes  Respiratory: Negative for cough and shortness of breath.   Cardiovascular:  Negative for chest pain, palpitations and leg swelling.  Gastrointestinal: Negative for blood in stool, constipation, diarrhea and heartburn.  Genitourinary: Negative for dysuria, frequency and urgency.  Musculoskeletal: Negative for falls, joint pain and myalgias.  Skin: Negative for rash.  Neurological: Negative for dizziness, sensory change and headaches.  Endo/Heme/Allergies: Does not bruise/bleed easily.  Psychiatric/Behavioral: Negative for depression, substance abuse and suicidal ideas. The patient is not nervous/anxious.    Objective:   Vitals:   09/08/20 1138  BP: 140/90  Pulse: 100  Temp: 98.6 F (37 C)  SpO2: 99%   Body mass index  is 36.02 kg/m.  Physical Examination:  Physical Exam Vitals and nursing note reviewed.  Constitutional:      General: She is not in acute distress.    Appearance: She is well-developed. She is obese.  HENT:     Right Ear: Tympanic membrane, ear canal and external ear normal.     Left Ear: Tympanic membrane, ear canal and external ear normal.     Nose: Nose normal.  Eyes:     Extraocular Movements: Extraocular movements intact.     Conjunctiva/sclera: Conjunctivae normal.  Cardiovascular:     Rate and Rhythm: Normal rate and regular rhythm.     Heart sounds: Normal heart sounds.  Pulmonary:     Effort: Pulmonary effort is normal. No respiratory distress.     Breath sounds: Normal breath sounds.  Chest:     Chest wall: No tenderness.     Breasts:        Right: Normal.        Left: Normal.  Abdominal:     General: Bowel sounds are normal. There is no distension.     Palpations: Abdomen is soft. There is mass. There is no hepatomegaly, splenomegaly or pulsatile mass.     Tenderness: There is no abdominal tenderness. There is no right CVA tenderness, left CVA tenderness, guarding or rebound.     Hernia: No hernia is present.     Comments: Palpable suprapubic mass  Genitourinary:    Comments: Deferred to next year per  patient Musculoskeletal:        General: Normal range of motion.     Cervical back: Normal range of motion and neck supple.     Right lower leg: No edema.     Left lower leg: No edema.  Lymphadenopathy:     Cervical: No cervical adenopathy.     Upper Body:     Right upper body: No supraclavicular, axillary or pectoral adenopathy.     Left upper body: No supraclavicular, axillary or pectoral adenopathy.  Skin:    General: Skin is warm and dry.  Neurological:     Mental Status: She is alert and oriented to person, place, and time.     Deep Tendon Reflexes: Reflexes are normal and symmetric.  Psychiatric:        Mood and Affect: Mood normal.        Behavior: Behavior normal.        Thought Content: Thought content normal.    ASSESSMENT and PLAN: This visit occurred during the SARS-CoV-2 public health emergency.  Safety protocols were in place, including screening questions prior to the visit, additional usage of staff PPE, and extensive cleaning of exam room while observing appropriate contact time as indicated for disinfecting solutions.   Horace was seen today for establish care.  Diagnoses and all orders for this visit:  Encounter for preventative adult health care exam with abnormal findings -     Comprehensive metabolic panel  Mixed hyperlipidemia -     Lipid panel -     rosuvastatin (CRESTOR) 10 MG tablet; Take 1 tablet (10 mg total) by mouth daily. -     Lipid panel; Future  Menorrhagia with regular cycle -     CBC with Differential/Platelet -     TSH -     Iron, TIBC and Ferritin Panel -     US Pelvic Complete With Transvaginal; Future  Screen for STD (sexually transmitted disease) -     Cancel: Urine cytology ancillary only([facility]) -     HIV antibody (with reflex) -     Hepatitis C Antibody -     RPR -     Urine cytology ancillary only([place]); Future  Pelvic mass in female -     US Pelvic Complete With Transvaginal; Future  Dysplasia of cervix,  high grade CIN 2      Problem List Items Addressed This Visit      Genitourinary   Dysplasia of cervix, high grade CIN 2    S/p LEEP 2/21/20214: HIGH GRADE SQUAMOUS INTRAEPITHELIAL LESION, CIN-II (MODERATE DYSPLASIA), WITH EXTENSION INTO ENDOCERVICAL GLANDS. THE SURGICAL RESECTION MARGINS ARE NEGATIVE FOR HIGH GRADE SQUAMOUS DYSPLASIA. Normal PAP 09/2013, 2016, 2017, 2018, 2019 and 2020 Positive HPV in 2019, but negative genotype 16,18, and 45. She agreed to defer to every 38yrs at this time.        Other   HLD (hyperlipidemia)    No improvement in lipid panel with crestor 5mg . Lipid Panel     Component Value Date/Time   CHOL 287 (H) 09/08/2020 1207   TRIG 92.0 09/08/2020 1207   HDL 100.80 09/08/2020 1207   CHOLHDL 3 09/08/2020 1207   VLDL 18.4 09/08/2020 1207   LDLCALC 168 (H) 09/08/2020 1207   LDLDIRECT 158.4 09/21/2013 1455   increase crestor to 10mg . Heart healthy diet and regular exercise is also necessary. Repeat lab in 78months (fasting). Order entered        Relevant Medications   rosuvastatin (CRESTOR) 10 MG tablet   Other Relevant Orders   Lipid panel (Completed)   Lipid panel   Menorrhagia with regular cycle    Reports heavy vaginal bleeding, clots and dysmenorrhea despite use of OCP for several months. chnaged from 3days to 7days every month Sexually active: use of OCP and condoms.  Ordered pelvic and transvaginal US, cbc and iron panel       Relevant Orders   CBC with Differential/Platelet (Completed)   TSH (Completed)   Iron, TIBC and Ferritin Panel (Completed)   US Pelvic Complete With Transvaginal    Other Visit Diagnoses    Encounter for preventative adult health care exam with abnormal findings    -  Primary   Relevant Orders   Comprehensive metabolic panel (Completed)   Screen for STD (sexually transmitted disease)       Relevant Orders   HIV antibody (with reflex) (Completed)   Hepatitis C Antibody (Completed)   RPR (Completed)   Urine  cytology ancillary only([place])   Pelvic mass in female       Relevant Orders   US Pelvic Complete With Transvaginal      Follow up: Return in about 1 year (around 09/08/2021) for CPE (fasting).  Wilfred Lacy, NP

## 2020-09-11 LAB — HEPATITIS C ANTIBODY
Hepatitis C Ab: NONREACTIVE
SIGNAL TO CUT-OFF: 0.03 (ref <1.00)

## 2020-09-11 LAB — IRON,TIBC AND FERRITIN PANEL
%SAT: 23 % (calc) (ref 16–45)
Ferritin: 28 ng/mL (ref 16–154)
Iron: 112 ug/dL (ref 40–190)
TIBC: 497 mcg/dL (calc) — ABNORMAL HIGH (ref 250–450)

## 2020-09-11 LAB — RPR: RPR Ser Ql: NONREACTIVE

## 2020-09-11 LAB — HIV ANTIBODY (ROUTINE TESTING W REFLEX): HIV 1&2 Ab, 4th Generation: NONREACTIVE

## 2020-09-12 ENCOUNTER — Other Ambulatory Visit: Payer: Self-pay | Admitting: Nurse Practitioner

## 2020-09-12 MED ORDER — ROSUVASTATIN CALCIUM 10 MG PO TABS: 10.0000 mg | ORAL_TABLET | Freq: Every day | ORAL | 3 refills | Status: AC

## 2020-09-12 NOTE — Assessment & Plan Note (Signed)
No improvement in lipid panel with crestor 5mg . Lipid Panel     Component Value Date/Time   CHOL 287 (H) 09/08/2020 1207   TRIG 92.0 09/08/2020 1207   HDL 100.80 09/08/2020 1207   CHOLHDL 3 09/08/2020 1207   VLDL 18.4 09/08/2020 1207   LDLCALC 168 (H) 09/08/2020 1207   LDLDIRECT 158.4 09/21/2013 1455   increase crestor to 10mg . Heart healthy diet and regular exercise is also necessary. Repeat lab in 49months (fasting). Order entered

## 2020-09-21 ENCOUNTER — Telehealth: Payer: Self-pay | Admitting: Nurse Practitioner

## 2020-09-21 ENCOUNTER — Ambulatory Visit
Admission: RE | Admit: 2020-09-21 | Discharge: 2020-09-21 | Disposition: A | Discharge status: Home / Self Care | Payer: BC Managed Care – PPO | Source: Ambulatory Visit | Attending: Nurse Practitioner | Admitting: Nurse Practitioner

## 2020-09-21 DIAGNOSIS — N920 Excessive and frequent menstruation with regular cycle: Secondary | ICD-10-CM

## 2020-09-21 DIAGNOSIS — R1900 Intra-abdominal and pelvic swelling, mass and lump, unspecified site: Secondary | ICD-10-CM

## 2020-09-21 DIAGNOSIS — D259 Leiomyoma of uterus, unspecified: Secondary | ICD-10-CM

## 2020-09-21 NOTE — Telephone Encounter (Signed)
GYN referral entered

## 2020-10-06 ENCOUNTER — Other Ambulatory Visit: Payer: Self-pay | Admitting: Nurse Practitioner

## 2020-10-24 ENCOUNTER — Other Ambulatory Visit: Payer: Self-pay

## 2020-10-24 ENCOUNTER — Encounter: Payer: Self-pay | Admitting: Obstetrics and Gynecology

## 2020-10-24 ENCOUNTER — Ambulatory Visit (INDEPENDENT_AMBULATORY_CARE_PROVIDER_SITE_OTHER): Payer: BC Managed Care – PPO | Admitting: Obstetrics and Gynecology

## 2020-10-24 VITALS — Ht 61.5 in | Wt 188.4 lb

## 2020-10-24 DIAGNOSIS — Z3041 Encounter for surveillance of contraceptive pills: Secondary | ICD-10-CM

## 2020-10-24 DIAGNOSIS — D219 Benign neoplasm of connective and other soft tissue, unspecified: Secondary | ICD-10-CM | POA: Diagnosis not present

## 2020-10-24 DIAGNOSIS — N946 Dysmenorrhea, unspecified: Secondary | ICD-10-CM | POA: Insufficient documentation

## 2020-10-24 DIAGNOSIS — N920 Excessive and frequent menstruation with regular cycle: Secondary | ICD-10-CM

## 2020-10-24 NOTE — Progress Notes (Signed)
Obstetrics and Gynecology New Patient Evaluation  Appointment Date: 10/24/2020  OBGYN Clinic: [facility] for [facility]  Primary Care Provider: Flossie Buffy  Referring Provider: Flossie Buffy, NP  Chief Complaint: fibroids, heavy and painful periods  History of Present Illness: Heidi Paul is a 34 y.o. African-American G0 (No LMP recorded.), seen for the above chief complaint. Her past medical history is significant for bmi 30s, fibroids, LEEP  Patient referred by her pcp for ultrasound findings of fibroids see below.  Patient has been on the same OCP Minerva Fester) for years but this year her periods have started to be heavy and painful. They are currently regular, every month and last for five days and with no intermenstrual bleeding. She did not start any new medications and her s/s started before her receiving the COVID shot in March. She has not tried skipping the placebo week.   She has a negative 10/201 tsh, cbc (hgb 13.9) and she has a neg pap in 2020  Review of Systems: Pertinent items are noted in HPI.   As Per HPI otherwise negative  Patient Active Problem List   Diagnosis Date Noted  . Dysmenorrhea 10/24/2020  . Menorrhagia with regular cycle 09/08/2020  . Intertrigo 05/03/2019  . HLD (hyperlipidemia) 04/30/2019  . Dysplasia of cervix, high grade CIN 2 11/30/2012    Past Medical History:  Past Medical History:  Diagnosis Date  . Abnormal Pap smear of cervix    x 3 , show positive hpv  . Amenorrhea 03/20/2015  . CIN II (cervical intraepithelial neoplasia II) 01/01/2013   S/p LEEP 01/01/2013   . Headache(784.0)     every other day.  . Obesity (BMI 30-39.9) 09/21/2013    Past Surgical History:  Past Surgical History:  Procedure Laterality Date  . COLPOSCOPY VULVA  2012 and 2011   x2 /positive hpv  . COLPOSCOPY VULVA W/ BIOPSY  2011   abnormal pap/ hpv  . LEEP  2014  . WISDOM TOOTH EXTRACTION     x4    Past Obstetrical History:   OB History  Gravida Para Term Preterm AB Living  0 0 0 0 0 0  SAB IAB Ectopic Multiple Live Births  0 0 0 0      Past Gynecological History: As per HPI.  Social History:  Social History   Socioeconomic History  . Marital status: Single    Spouse name: Not on file  . Number of children: 0  . Years of education: Not on file  . Highest education level: Not on file  Occupational History  . Not on file  Tobacco Use  . Smoking status: Never Smoker  . Smokeless tobacco: Never Used  Substance and Sexual Activity  . Alcohol use: Yes    Comment: occasion  . Drug use: Never  . Sexual activity: Yes    Partners: Male    Birth control/protection: Pill, Condom  Other Topics Concern  . 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   Social Determinants of Health   Financial Resource Strain: Not on file  Food Insecurity: Not on file  Transportation Needs: Not on file  Physical Activity: Not on file  Stress: Not on file  Social Connections: Not on file  Intimate Partner Violence: Not on file    Family History:  Family History  Problem Relation Age of Onset  . Diabetes Maternal Grandmother   . Hyperlipidemia Maternal Grandmother   . Hypertension  Maternal Grandmother   . Cancer Father        prostate  . Hyperlipidemia Father   . Cancer Paternal Grandfather        prostate  . Hyperlipidemia Paternal Grandfather   . Hypertension Paternal Grandfather   . Lupus Paternal Aunt     Medications Aggie Hacker had no medications administered during this visit. Current Outpatient Medications  Medication Sig Dispense Refill  . desogestrel-ethinyl estradiol (KARIVA) 0.15-0.02/0.01 MG (21/5) tablet Take 1 tablet by mouth daily. 28 tablet 11  . rosuvastatin (CRESTOR) 10 MG tablet Take 1 tablet (10 mg total) by mouth daily. 90 tablet 3  . nystatin (MYCOSTATIN/NYSTOP) powder Apply topically 4 (four) times daily. (Patient not taking: Reported on 10/24/2020)  15 g 0   No current facility-administered medications for this visit.    Allergies Biaxin [clarithromycin]   Physical Exam:  Ht 5' 1.5" (1.562 m)   Wt 188 lb 6.4 oz (85.5 kg)   BMI 35.02 kg/m  Body mass index is 35.02 kg/m. General appearance: Well nourished, well developed female in no acute distress.  Cardiovascular: normal s1 and s2.  No murmurs, rubs or gallops. Respiratory:  Clear to auscultation bilateral. Normal respiratory effort Abdomen: positive bowel sounds and no masses, hernias; diffusely non tender to palpation, non distended Neuro/Psych:  Normal mood and affect.  Skin:  Warm and dry.   Laboratory: as per hpi  Radiology: images reviewed Narrative & Impression  CLINICAL DATA:  34 year old female with concern for pelvic mass on physical exam.  EXAM: TRANSABDOMINAL AND TRANSVAGINAL ULTRASOUND OF PELVIS  TECHNIQUE: Both transabdominal and transvaginal ultrasound examinations of the pelvis were performed. Transabdominal technique was performed for global imaging of the pelvis including uterus, ovaries, adnexal regions, and pelvic cul-de-sac. It was necessary to proceed with endovaginal exam following the transabdominal exam to visualize the endometrium and ovaries.  COMPARISON:  None  FINDINGS: Uterus  Measurements: 13.4 x 8.1 x 3.5 cm = volume: 600 mL. The uterus is enlarged and myomatous. There is a 4.7 x 3.4 x 3.9 cm anterior upper body/fundal fibroid. Additional fibroids are likely present but suboptimally visualized due to overlying bowel gas and enlarged uterine size. MRI may provide better evaluation if clinically indicated.  Endometrium  Thickness: 7 mm. The endometrium is suboptimally visualized. The visualized portion of the endometrium appears unremarkable.  Right ovary  Measurements: 3.6 x 1.8 x 1.8 cm = volume: 61 mL. Normal appearance/no adnexal mass.  Left ovary  Measurements: 4.2 x 2.0 x 2.0 cm = volume: 87 mL.  Normal appearance/no adnexal mass.  Other findings  No abnormal free fluid.  IMPRESSION: 1. Enlarged myomatous uterus. 2. Unremarkable visualized endometrium and ovaries.   Electronically Signed   By: Anner Crete M.D.   On: 09/21/2020 15:48    Assessment: pt stable  Plan:  1. Dysmenorrhea Her u/s images aren't the best so I told her I recommend an MRI to get a better idea of the types, sizes and location of her fibroids. D/w pt and she is amenable to using her OCPs continuously for now while we await her MRI results.  2. Menorrhagia with regular cycle - MR PELVIS W WO CONTRAST; Future  3. Encounter for surveillance of contraceptive pills  4. Fibroids - MR PELVIS W WO CONTRAST; Future  Orders Placed This Encounter  Procedures  . MR PELVIS W WO CONTRAST    RTC after MRI  Durene Romans MD Attending Center for [facility])

## 2020-10-24 NOTE — Patient Instructions (Signed)
I recommend skipping the placebo pills with your birth control

## 2020-10-25 DIAGNOSIS — D219 Benign neoplasm of connective and other soft tissue, unspecified: Secondary | ICD-10-CM | POA: Insufficient documentation

## 2020-11-01 ENCOUNTER — Ambulatory Visit
Admission: RE | Admit: 2020-11-01 | Discharge: 2020-11-01 | Disposition: A | Discharge status: Home / Self Care | Payer: BC Managed Care – PPO | Source: Ambulatory Visit | Attending: Obstetrics and Gynecology | Admitting: Obstetrics and Gynecology

## 2020-11-01 ENCOUNTER — Other Ambulatory Visit: Payer: Self-pay

## 2020-11-01 DIAGNOSIS — D219 Benign neoplasm of connective and other soft tissue, unspecified: Secondary | ICD-10-CM | POA: Insufficient documentation

## 2020-11-01 DIAGNOSIS — N920 Excessive and frequent menstruation with regular cycle: Secondary | ICD-10-CM | POA: Diagnosis not present

## 2020-11-01 MED ORDER — GADOBUTROL 1 MMOL/ML IV SOLN
8.0000 mL | Freq: Once | INTRAVENOUS | Status: AC | PRN
  Administered 2020-11-01: 17:00:00 8 mL via INTRAVENOUS

## 2020-11-21 ENCOUNTER — Telehealth: Payer: Self-pay | Admitting: Obstetrics and Gynecology

## 2020-11-21 NOTE — Telephone Encounter (Signed)
GYN Telephone Note Patient called at [phone] and VM left that I will call her tomorrow  Durene Romans MD Attending [facility] for [place] (Faculty Practice) 11/21/2020 Time: 1153am

## 2020-11-23 ENCOUNTER — Telehealth: Payer: Self-pay | Admitting: Obstetrics and Gynecology

## 2020-11-23 DIAGNOSIS — D219 Benign neoplasm of connective and other soft tissue, unspecified: Secondary | ICD-10-CM

## 2020-11-23 NOTE — Telephone Encounter (Signed)
GYN Telephone Note D/w patient re: fibroids. I told her that the good thing is that the fibroids are large and discrete and not multiple small ones. She had no bleeding or pain with skipping her placebo week and starting a new hormone pill pack. I told her that proceeding with the fibroids is really based on her s/s. I told her that the location of the fibroids may or may not affect her fertility but the only way to know is when she ever decides to try and get pregnant and see if she is successful, but I did tell her that any surgery to remove the fibroids has the potential to negatively affect her fertility. She has some bulk, pressure s/s and bloating.   I told her that I recommend seeing a [place] specialist to go over the feasibility and pros and cons of any potential procedure to remove the fibroids and I also told her that if she elects for surgery that they may be able to do laparoscopically.  Will set up referral for her to see [facility]  Durene Romans MD Attending [facility] [place] (Faculty Practice) 11/23/2020 Time: [id number]

## 2020-11-28 ENCOUNTER — Telehealth: Payer: Self-pay | Admitting: Radiology

## 2020-11-28 NOTE — Telephone Encounter (Signed)
Left message to call [id number] for updated insurance information so that I may send referral to [place]

## 2021-03-05 ENCOUNTER — Encounter: Payer: Self-pay | Admitting: Radiology

## 2021-08-11 IMAGING — US US PELVIS COMPLETE WITH TRANSVAGINAL
1 series · 13 of 25 positions shown · non-contrast
Comparison: None

CLINICAL DATA: 34-year-old female with concern for pelvic mass on
physical exam.

EXAM:
TRANSABDOMINAL AND TRANSVAGINAL ULTRASOUND OF PELVIS
TECHNIQUE: Both transabdominal and transvaginal ultrasound examinations of the
pelvis were performed. Transabdominal technique was performed for
global imaging of the pelvis including uterus, ovaries, adnexal
regions, and pelvic cul-de-sac. It was necessary to proceed with
endovaginal exam following the transabdominal exam to visualize the
endometrium and ovaries.

[Series 1: us pelvis complete with transvaginal · 0.36mm/px · 13 of 82 slices shown]
[im 1/82]
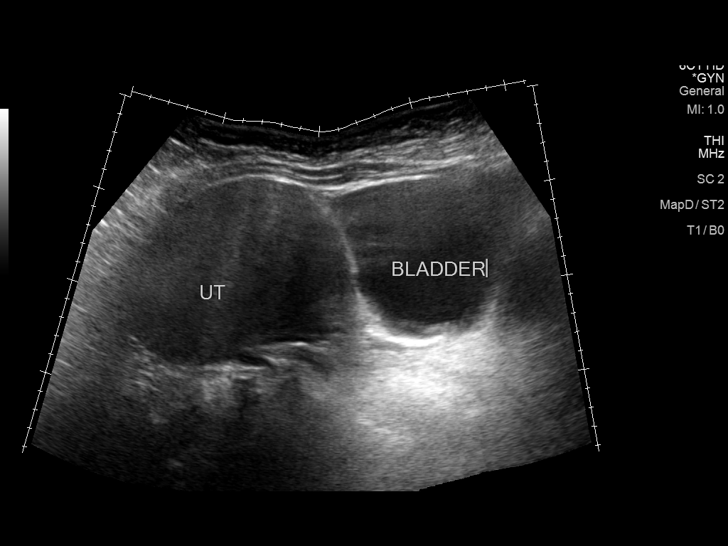
[im 7/82]
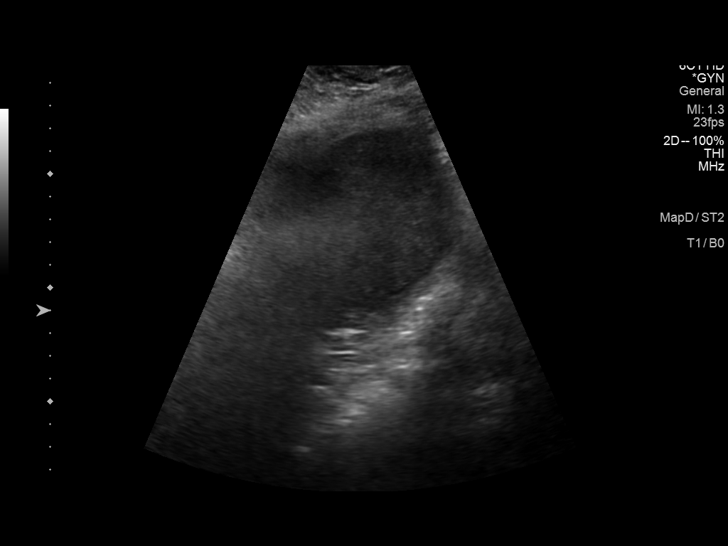
[im 14/82]
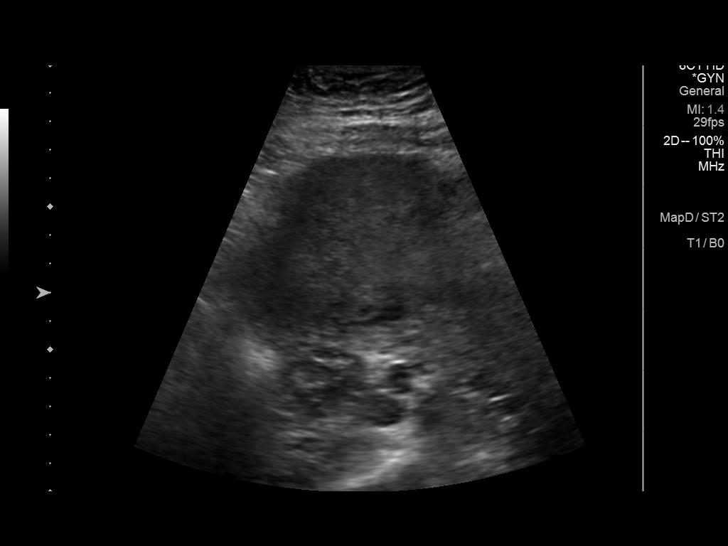
[im 21/82]
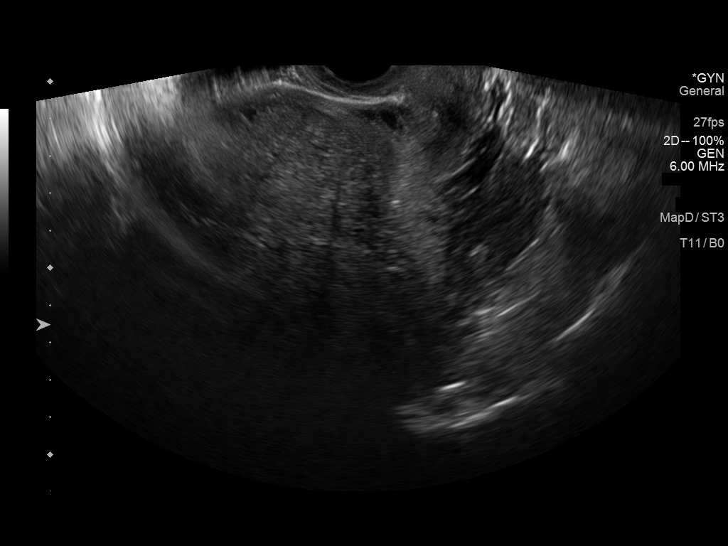
[im 28/82]
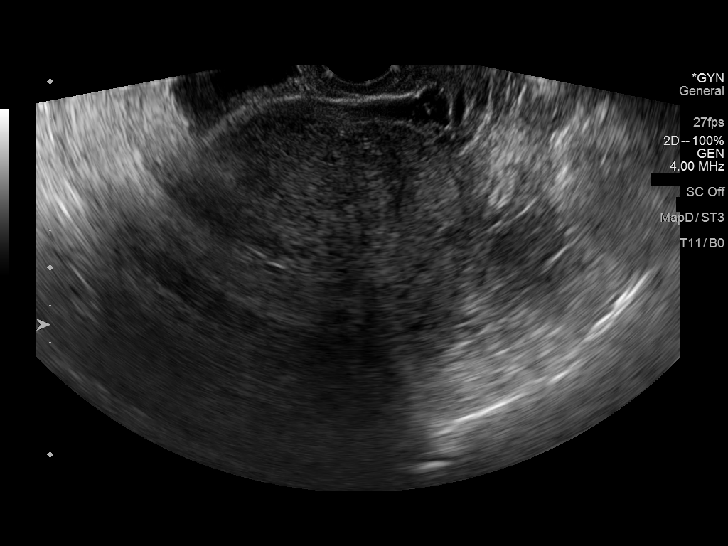
[im 34/82]
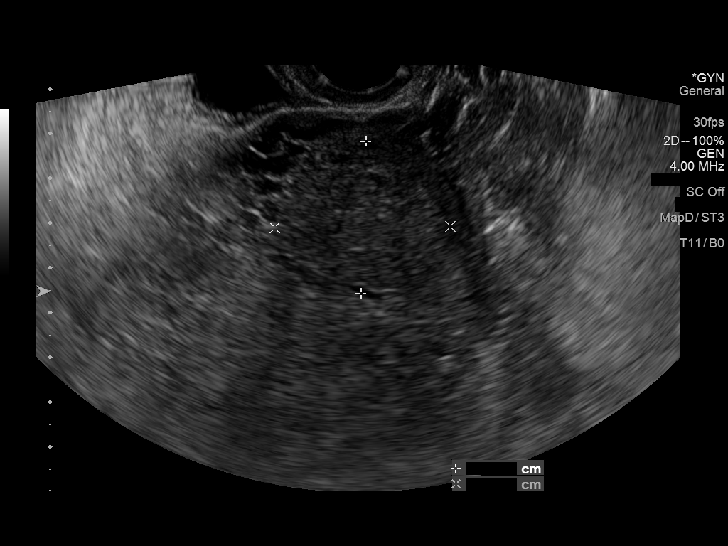
[im 41/82]
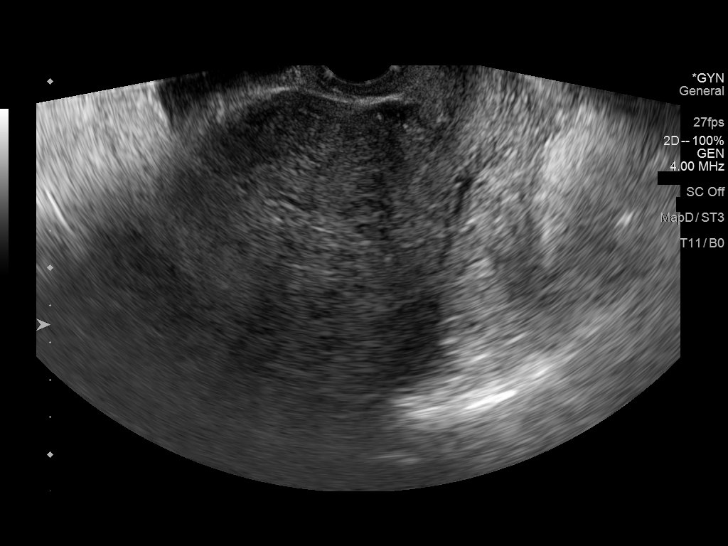
[im 48/82]
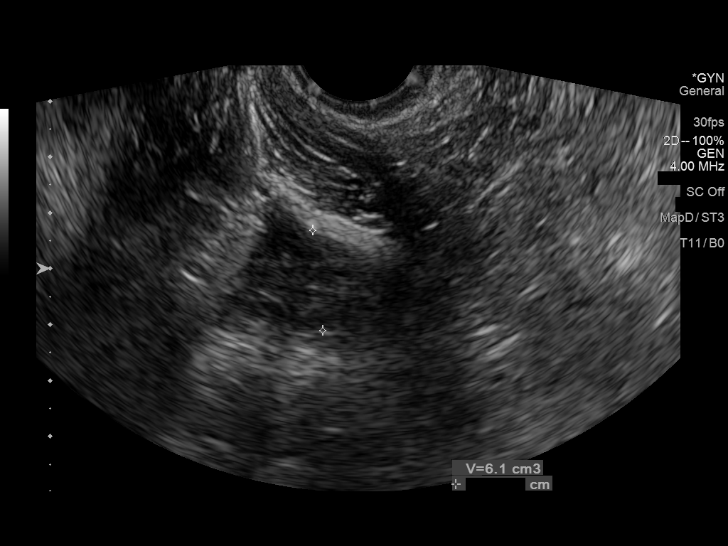
[im 55/82]
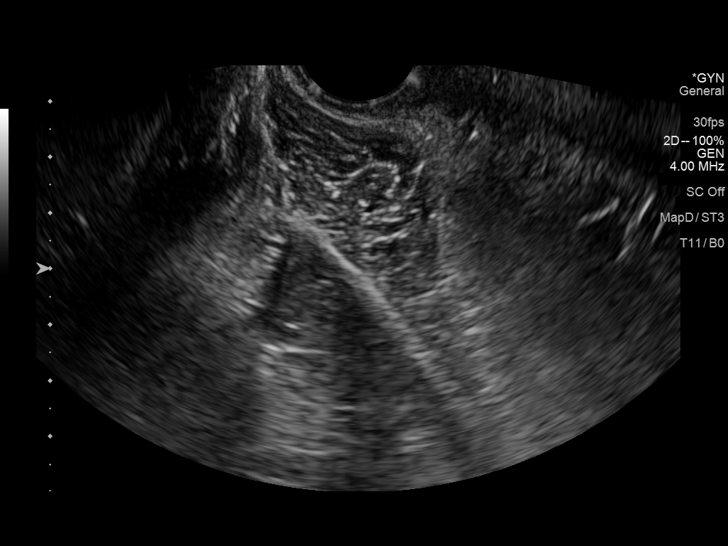
[im 61/82]
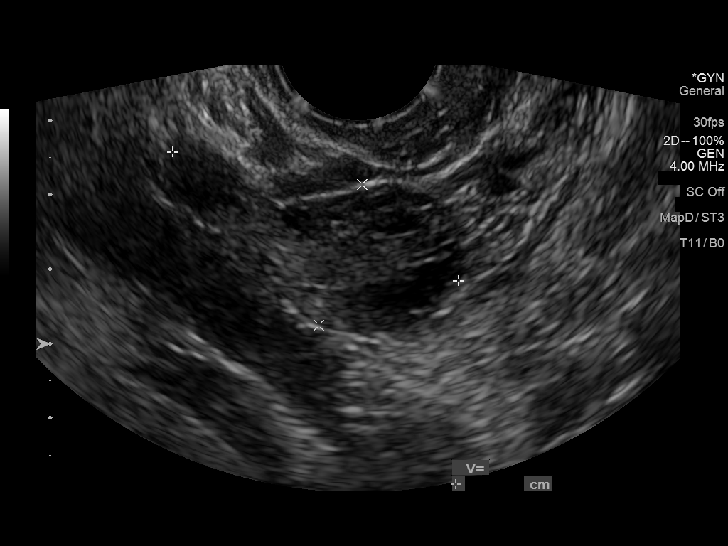
[im 68/82]
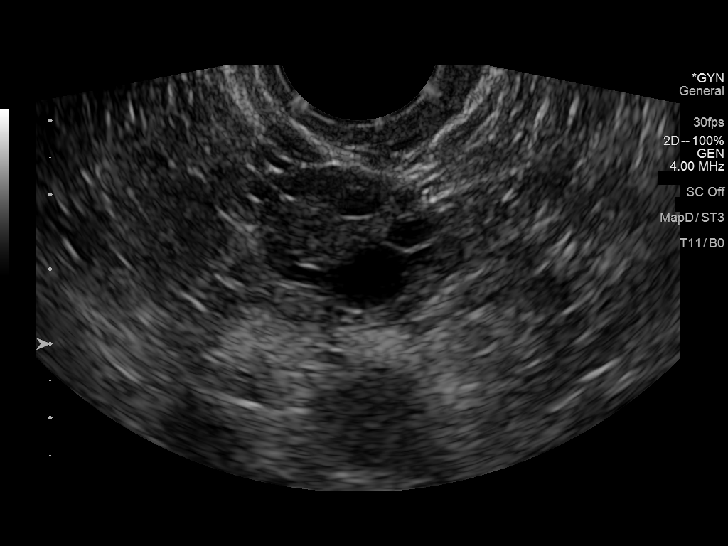
[im 75/82]
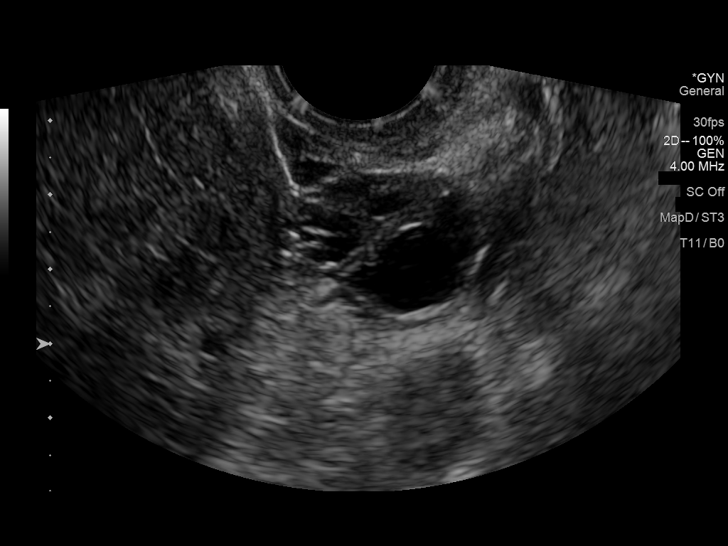
[im 82/82]
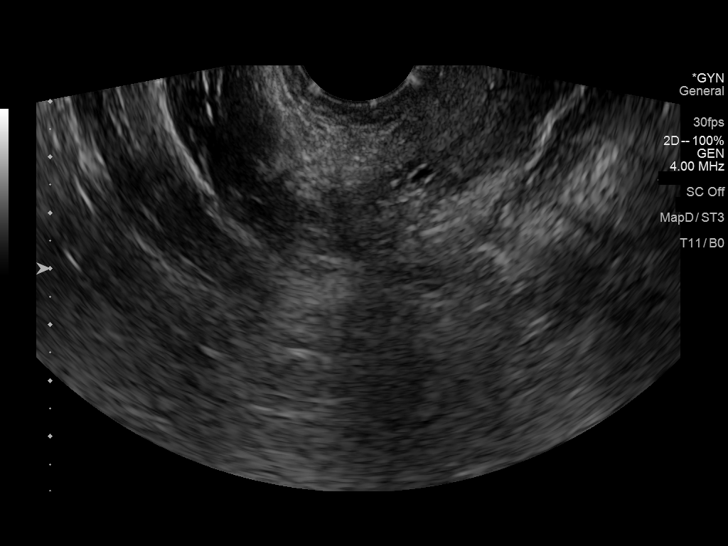

[13 of 25 positions shown; findings below may reference images not displayed]

FINDINGS: Uterus

Measurements: 13.4 x 8.1 x 3.5 cm = volume: 600 mL. The uterus is
enlarged and myomatous. There is a 4.7 x 3.4 x 3.9 cm anterior upper
body/fundal fibroid. Additional fibroids are likely present but
suboptimally visualized due to overlying bowel gas and enlarged
uterine size. MRI may provide better evaluation if clinically
indicated.

Endometrium

Thickness: 7 mm. The endometrium is suboptimally visualized. The
visualized portion of the endometrium appears unremarkable.

Right ovary

Measurements: 3.6 x 1.8 x 1.8 cm = volume: 61 mL. Normal
appearance/no adnexal mass.

Left ovary

Measurements: 4.2 x 2.0 x 2.0 cm = volume: 87 mL. Normal
appearance/no adnexal mass.

Other findings

No abnormal free fluid.
IMPRESSION: 1. Enlarged myomatous uterus.
2. Unremarkable visualized endometrium and ovaries.

## 2021-09-10 ENCOUNTER — Encounter: Payer: BC Managed Care – PPO | Admitting: Nurse Practitioner

## 2021-10-02 ENCOUNTER — Other Ambulatory Visit: Payer: Self-pay

## 2021-10-03 ENCOUNTER — Ambulatory Visit (INDEPENDENT_AMBULATORY_CARE_PROVIDER_SITE_OTHER): Payer: BC Managed Care – PPO | Admitting: Nurse Practitioner

## 2021-10-03 ENCOUNTER — Encounter: Payer: Self-pay | Admitting: Nurse Practitioner

## 2021-10-03 VITALS — BP 150/100 | HR 92 | Temp 97.7°F | Ht 61.5 in | Wt 194.4 lb

## 2021-10-03 DIAGNOSIS — E782 Mixed hyperlipidemia: Secondary | ICD-10-CM

## 2021-10-03 DIAGNOSIS — R030 Elevated blood-pressure reading, without diagnosis of hypertension: Secondary | ICD-10-CM

## 2021-10-03 DIAGNOSIS — D500 Iron deficiency anemia secondary to blood loss (chronic): Secondary | ICD-10-CM | POA: Diagnosis not present

## 2021-10-03 DIAGNOSIS — Z0001 Encounter for general adult medical examination with abnormal findings: Secondary | ICD-10-CM | POA: Diagnosis not present

## 2021-10-03 LAB — CBC WITH DIFFERENTIAL/PLATELET
Basophils Absolute: 0.1 10*3/uL (ref 0.0–0.1)
Basophils Relative: 0.9 % (ref 0.0–3.0)
Eosinophils Absolute: 0 10*3/uL (ref 0.0–0.7)
Eosinophils Relative: 0.8 % (ref 0.0–5.0)
HCT: 45.6 % (ref 36.0–46.0)
Hemoglobin: 14.9 g/dL (ref 12.0–15.0)
Lymphocytes Relative: 42 % (ref 12.0–46.0)
Lymphs Abs: 2.6 10*3/uL (ref 0.7–4.0)
MCHC: 32.7 g/dL (ref 30.0–36.0)
MCV: 90.7 fl (ref 78.0–100.0)
Monocytes Absolute: 0.4 10*3/uL (ref 0.1–1.0)
Monocytes Relative: 6.1 % (ref 3.0–12.0)
Neutro Abs: 3.1 10*3/uL (ref 1.4–7.7)
Neutrophils Relative %: 50.2 % (ref 43.0–77.0)
Platelets: 350 10*3/uL (ref 150.0–400.0)
RBC: 5.03 Mil/uL (ref 3.87–5.11)
RDW: 15.9 % — ABNORMAL HIGH (ref 11.5–15.5)
WBC: 6.2 10*3/uL (ref 4.0–10.5)

## 2021-10-03 LAB — LIPID PANEL
Cholesterol: 294 mg/dL — ABNORMAL HIGH (ref 0–200)
HDL: 47.7 mg/dL (ref >39.00)
LDL Cholesterol: 233 mg/dL — ABNORMAL HIGH (ref 0–99)
NonHDL: 246.52
Total CHOL/HDL Ratio: 6
Triglycerides: 67 mg/dL (ref 0.0–149.0)
VLDL: 13.4 mg/dL (ref 0.0–40.0)

## 2021-10-03 LAB — COMPREHENSIVE METABOLIC PANEL
ALT: 22 U/L (ref 0–35)
AST: 18 U/L (ref 0–37)
Albumin: 4.2 g/dL (ref 3.5–5.2)
Alkaline Phosphatase: 32 U/L — ABNORMAL LOW (ref 39–117)
BUN: 12 mg/dL (ref 6–23)
CO2: 24 mEq/L (ref 19–32)
Calcium: 9.1 mg/dL (ref 8.4–10.5)
Chloride: 104 mEq/L (ref 96–112)
Creatinine, Ser: 0.88 mg/dL (ref 0.40–1.20)
GFR: 85.49 mL/min (ref >60.00)
Glucose, Bld: 82 mg/dL (ref 70–99)
Potassium: 4.2 mEq/L (ref 3.5–5.1)
Sodium: 136 mEq/L (ref 135–145)
Total Bilirubin: 0.4 mg/dL (ref 0.2–1.2)
Total Protein: 7.1 g/dL (ref 6.0–8.3)

## 2021-10-03 LAB — TSH: TSH: 1.57 u[IU]/mL (ref 0.35–5.50)

## 2021-10-03 NOTE — Progress Notes (Addendum)
Subjective:    Patient ID: Heidi Paul, female    DOB: [birth year], 35 y.o.   MRN: [id number]  Patient presents today for CPE and eval of chronic conditions  HPI Elevated BP without diagnosis of hypertension Repeated BP in supine position, still elevated. Asymptomatic BP Readings from Last 3 Encounters:  10/03/21 (!) 150/100  09/08/20 140/90  05/03/19 122/86   Check BP 3x/week and send readings through mychart in 1week. Maintain DASH diet, adequate oral hydration and daily exercise.  Iron deficiency anemia due to chronic blood loss Repeat cbc and iron panel. Current use of progesterone, managed by GYN.   Vision:not needed per patient Dental:up to date Diet:regular diet Exercise:none Weight:  Wt Readings from Last 3 Encounters:  10/03/21 194 lb 6.4 oz (88.2 kg)  10/24/20 188 lb 6.4 oz (85.5 kg)  09/08/20 192 lb 3.2 oz (87.2 kg)    Sexual History (orientation,birth control, marital status, STD):deferred breast and pelvic exam to GYN. Last PAP 04/2019 (normal)  Depression/Suicide: Depression screen [facility] 2/9 10/03/2021 09/08/2020 05/03/2019 04/29/2018  Decreased Interest 0 0 0 0  Down, Depressed, Hopeless 0 0 0 0  PHQ - 2 Score 0 0 0 0   Immunizations: (TDAP, Hep C screen, Pneumovax, Influenza, zoster)  Health Maintenance  Topic Date Due   COVID-19 Vaccine (4 - Booster for Pfizer series) 12/28/2020   Flu Shot  02/08/2022*   Pap Smear  05/02/2022   Tetanus Vaccine  04/29/2028   Hepatitis C Screening: USPSTF Recommendation to screen - Ages 18-79 yo.  Completed   HIV Screening  Completed   Pneumococcal Vaccination  Aged Out   HPV Vaccine  Aged Out  *Topic was postponed. The date shown is not the original due date.   Medications and allergies reviewed with patient and updated if appropriate.  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     rosuvastatin (CRESTOR) 10 MG tablet Take 1 tablet (10 mg total) by mouth daily. 90 tablet 3   nystatin (MYCOSTATIN/NYSTOP) powder Apply topically 4 (four) times daily. (Patient not taking: Reported on 10/24/2020) 15 g 0   No current facility-administered medications on file prior to visit.    Past Medical History:  Diagnosis Date   Abnormal Pap smear of cervix    x 3 , show positive hpv   Amenorrhea 03/20/2015   CIN II (cervical intraepithelial neoplasia II) 01/01/2013   S/p LEEP 01/01/2013    Headache(784.0)     every other day.   Obesity (BMI 30-39.9) 09/21/2013    Past Surgical History:  Procedure Laterality Date   COLPOSCOPY VULVA  2012 and 2011   x2 /positive hpv   COLPOSCOPY VULVA W/ BIOPSY  2011   abnormal pap/ hpv   LEEP  2014   WISDOM TOOTH EXTRACTION     x4   Social History   Socioeconomic History   Marital status: Single    Spouse name: Not on file   Number of children: 0   Years of education: Not  on file   Highest education level: Not on file  Occupational History   Not on file  Tobacco Use   Smoking status: Never   Smokeless tobacco: Never  Substance and Sexual Activity   Alcohol use: Yes    Comment: occasion   Drug use: Never   Sexual activity: Yes    Partners: Male    Birth control/protection: Pill, Condom  Other Topics Concern   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   Social Determinants of Health   Financial Resource Strain: Not on file  Food Insecurity: Not on file  Transportation Needs: Not on file  Physical Activity: Not on file  Stress: Not on file   Social Connections: Not on file    Family History  Problem Relation Age of Onset   Diabetes Maternal Grandmother    Hyperlipidemia Maternal Grandmother    Hypertension Maternal Grandmother    Cancer Father        prostate   Hyperlipidemia Father    Cancer Paternal Grandfather        prostate   Hyperlipidemia Paternal Grandfather    Hypertension Paternal Grandfather    Lupus Paternal Aunt        Review of Systems  Constitutional:  Negative for fever, malaise/fatigue and weight loss.  HENT:  Negative for congestion and sore throat.   Eyes:        Negative for visual changes  Respiratory:  Negative for cough and shortness of breath.   Cardiovascular:  Negative for chest pain, palpitations and leg swelling.  Gastrointestinal:  Negative for blood in stool, constipation, diarrhea and heartburn.  Genitourinary:  Negative for dysuria, frequency and urgency.  Musculoskeletal:  Negative for falls, joint pain and myalgias.  Skin:  Negative for rash.  Neurological:  Negative for dizziness, sensory change and headaches.  Endo/Heme/Allergies:  Does not bruise/bleed easily.  Psychiatric/Behavioral:  Negative for depression, substance abuse and suicidal ideas. The patient is not nervous/anxious.    Objective:   Vitals:   10/03/21 0840  BP: (!) 150/100  Pulse: 92  Temp: 97.7 F (36.5 C)  SpO2: 99%    Body mass index is 36.14 kg/m.   Physical Examination:  Physical Exam Vitals reviewed.  Constitutional:      General: She is not in acute distress.    Appearance: She is obese.  HENT:     Right Ear: Tympanic membrane, ear canal and external ear normal.     Left Ear: Tympanic membrane, ear canal and external ear normal.  Eyes:     General: No scleral icterus.    Extraocular Movements: Extraocular movements intact.     Conjunctiva/sclera: Conjunctivae normal.  Cardiovascular:     Rate and Rhythm: Normal rate and regular rhythm.     Pulses: Normal pulses.     Heart sounds:  Normal heart sounds.  Pulmonary:     Effort: Pulmonary effort is normal. No respiratory distress.     Breath sounds: Normal breath sounds.  Abdominal:     General: Bowel sounds are normal. There is no distension.     Palpations: Abdomen is soft.  Musculoskeletal:        General: Normal range of motion.     Cervical back: Normal range of motion and neck supple.     Right lower leg: No edema.     Left lower leg: No edema.  Lymphadenopathy:     Cervical: No cervical adenopathy.  Skin:  General: Skin is warm and dry.  Neurological:     Mental Status: She is alert and oriented to person, place, and time.  Psychiatric:        Mood and Affect: Mood normal.        Behavior: Behavior normal.        Thought Content: Thought content normal.    ASSESSMENT and PLAN: This visit occurred during the SARS-CoV-2 public health emergency.  Safety protocols were in place, including screening questions prior to the visit, additional usage of staff PPE, and extensive cleaning of exam room while observing appropriate contact time as indicated for disinfecting solutions.   Blessing was seen today for annual exam.  Diagnoses and all orders for this visit:  Encounter for preventative adult health care exam with abnormal findings -     Comprehensive metabolic panel -     TSH  Mixed hyperlipidemia -     Lipid panel  Iron deficiency anemia due to chronic blood loss -     CBC with Differential/Platelet -     Iron, TIBC and Ferritin Panel  Elevated BP without diagnosis of hypertension Check BP 3x/week and send readings through mychart in 1week. Maintain DASH diet, adequate oral hydration and daily exercise. Perform monthly self breast exam.     Problem List Items Addressed This Visit       Other   Elevated BP without diagnosis of hypertension    Repeated BP in supine position, still elevated. Asymptomatic BP Readings from Last 3 Encounters:  10/03/21 (!) 150/100  09/08/20 140/90  05/03/19  122/86  Check BP 3x/week and send readings through mychart in 1week. Maintain DASH diet, adequate oral hydration and daily exercise.      HLD (hyperlipidemia)   Relevant Orders   Lipid panel (Completed)   Iron deficiency anemia due to chronic blood loss    Repeat cbc and iron panel. Current use of progesterone, managed by GYN.      Relevant Orders   CBC with Differential/Platelet (Completed)   Iron, TIBC and Ferritin Panel (Completed)   Other Visit Diagnoses     Encounter for preventative adult health care exam with abnormal findings    -  Primary   Relevant Orders   Comprehensive metabolic panel (Completed)   TSH (Completed)       Follow up: Return in about 3 months (around 01/03/2022) for elevated BP.  Wilfred Lacy, NP

## 2021-10-03 NOTE — Patient Instructions (Signed)
Check BP 3x/week and send readings through mychart in 1week. Maintain DASH diet, adequate oral hydration and daily exercise. Perform monthly self breast exam.  Go to lab for blood draw  DASH Eating Plan DASH stands for Dietary Approaches to Stop Hypertension. The DASH eating plan is a healthy eating plan that has been shown to: Reduce high blood pressure (hypertension). Reduce your risk for type 2 diabetes, heart disease, and stroke. Help with weight loss. What are tips for following this plan? Reading food labels Check food labels for the amount of salt (sodium) per serving. Choose foods with less than 5 percent of the Daily Value of sodium. Generally, foods with less than 300 milligrams (mg) of sodium per serving fit into this eating plan. To find whole grains, look for the word "whole" as the first word in the ingredient list. Shopping Buy products labeled as "low-sodium" or "no salt added." Buy fresh foods. Avoid canned foods and pre-made or frozen meals. Cooking Avoid adding salt when cooking. Use salt-free seasonings or herbs instead of table salt or sea salt. Check with your health care provider or pharmacist before using salt substitutes. Do not fry foods. Cook foods using healthy methods such as baking, boiling, grilling, roasting, and broiling instead. Cook with heart-healthy oils, such as olive, canola, avocado, soybean, or sunflower oil. Meal planning  Eat a balanced diet that includes: 4 or more servings of fruits and 4 or more servings of vegetables each day. Try to fill one-half of your plate with fruits and vegetables. 6-8 servings of whole grains each day. Less than 6 oz (170 g) of lean meat, poultry, or fish each day. A 3-oz (85-g) serving of meat is about the same size as a deck of cards. One egg equals 1 oz (28 g). 2-3 servings of low-fat dairy each day. One serving is 1 cup (237 mL). 1 serving of nuts, seeds, or beans 5 times each week. 2-3 servings of heart-healthy  fats. Healthy fats called omega-3 fatty acids are found in foods such as walnuts, flaxseeds, fortified milks, and eggs. These fats are also found in cold-water fish, such as sardines, salmon, and mackerel. Limit how much you eat of: Canned or prepackaged foods. Food that is high in trans fat, such as some fried foods. Food that is high in saturated fat, such as fatty meat. Desserts and other sweets, sugary drinks, and other foods with added sugar. Full-fat dairy products. Do not salt foods before eating. Do not eat more than 4 egg yolks a week. Try to eat at least 2 vegetarian meals a week. Eat more home-cooked food and less restaurant, buffet, and fast food. Lifestyle When eating at a restaurant, ask that your food be prepared with less salt or no salt, if possible. If you drink alcohol: Limit how much you use to: 0-1 drink a day for women who are not pregnant. 0-2 drinks a day for men. Be aware of how much alcohol is in your drink. In the [place] one drink equals one 12 oz bottle of beer (355 mL), one 5 oz glass of wine (148 mL), or one 1 oz glass of hard liquor (44 mL). General information Avoid eating more than 2,300 mg of salt a day. If you have hypertension, you may need to reduce your sodium intake to 1,500 mg a day. Work with your health care provider to maintain a healthy body weight or to lose weight. Ask what an ideal weight is for you. Get at least 30 minutes  of exercise that causes your heart to beat faster (aerobic exercise) most days of the week. Activities may include walking, swimming, or biking. Work with your health care provider or dietitian to adjust your eating plan to your individual calorie needs. What foods should I eat? Fruits All fresh, dried, or frozen fruit. Canned fruit in natural juice (without added sugar). Vegetables Fresh or frozen vegetables (raw, steamed, roasted, or grilled). Low-sodium or reduced-sodium tomato and vegetable juice. Low-sodium or  reduced-sodium tomato sauce and tomato paste. Low-sodium or reduced-sodium canned vegetables. Grains Whole-grain or whole-wheat bread. Whole-grain or whole-wheat pasta. Brown rice. Heidi Paul. Bulgur. Whole-grain and low-sodium cereals. Pita bread. Low-fat, low-sodium crackers. Whole-wheat flour tortillas. Meats and other proteins Skinless chicken or [place]. Ground chicken or [place]. Pork with fat trimmed off. Fish and seafood. Egg whites. Dried beans, peas, or lentils. Unsalted nuts, nut butters, and seeds. Unsalted canned beans. Lean cuts of beef with fat trimmed off. Low-sodium, lean precooked or cured meat, such as sausages or meat loaves. Dairy Low-fat (1%) or fat-free (skim) milk. Reduced-fat, low-fat, or fat-free cheeses. Nonfat, low-sodium ricotta or cottage cheese. Low-fat or nonfat yogurt. Low-fat, low-sodium cheese. Fats and oils Soft margarine without trans fats. Vegetable oil. Reduced-fat, low-fat, or light mayonnaise and salad dressings (reduced-sodium). Canola, safflower, olive, avocado, soybean, and sunflower oils. Avocado. Seasonings and condiments Herbs. Spices. Seasoning mixes without salt. Other foods Unsalted popcorn and pretzels. Fat-free sweets. The items listed above may not be a complete list of foods and beverages you can eat. Contact a dietitian for more information. What foods should I avoid? Fruits Canned fruit in a light or heavy syrup. Fried fruit. Fruit in cream or butter sauce. Vegetables Creamed or fried vegetables. Vegetables in a cheese sauce. Regular canned vegetables (not low-sodium or reduced-sodium). Regular canned tomato sauce and paste (not low-sodium or reduced-sodium). Regular tomato and vegetable juice (not low-sodium or reduced-sodium). Heidi Paul. Olives. Grains Baked goods made with fat, such as croissants, muffins, or some breads. Dry pasta or rice meal packs. Meats and other proteins Fatty cuts of meat. Ribs. Fried meat. Heidi Paul. Bologna,  salami, and other precooked or cured meats, such as sausages or meat loaves. Fat from the back of a pig (fatback). Bratwurst. Salted nuts and seeds. Canned beans with added salt. Canned or smoked fish. Whole eggs or egg yolks. Chicken or [place] with skin. Dairy Whole or 2% milk, cream, and half-and-half. Whole or full-fat cream cheese. Whole-fat or sweetened yogurt. Full-fat cheese. Nondairy creamers. Whipped toppings. Processed cheese and cheese spreads. Fats and oils Butter. Stick margarine. Lard. Shortening. Ghee. Bacon fat. Tropical oils, such as coconut, palm kernel, or palm oil. Seasonings and condiments Onion salt, garlic salt, seasoned salt, table salt, and sea salt. Worcestershire sauce. Tartar sauce. Barbecue sauce. Teriyaki sauce. Soy sauce, including reduced-sodium. Steak sauce. Canned and packaged gravies. Fish sauce. Oyster sauce. Cocktail sauce. Store-bought horseradish. Ketchup. Mustard. Meat flavorings and tenderizers. Bouillon cubes. Hot sauces. Pre-made or packaged marinades. Pre-made or packaged taco seasonings. Relishes. Regular salad dressings. Other foods Salted popcorn and pretzels. The items listed above may not be a complete list of foods and beverages you should avoid. Contact a dietitian for more information. Where to find more information National Heart, Lung, and Blood Institute: https://wilson-eaton.com/ American Heart Association: www.heart.org Academy of Nutrition and Dietetics: www.eatright.Heidi Paul: www.kidney.org Summary The DASH eating plan is a healthy eating plan that has been shown to reduce high blood pressure (hypertension). It may also reduce your risk for type  2 diabetes, heart disease, and stroke. When on the DASH eating plan, aim to eat more fresh fruits and vegetables, whole grains, lean proteins, low-fat dairy, and heart-healthy fats. With the DASH eating plan, you should limit salt (sodium) intake to 2,300 mg a day. If you have  hypertension, you may need to reduce your sodium intake to 1,500 mg a day. Work with your health care provider or dietitian to adjust your eating plan to your individual calorie needs. This information is not intended to replace advice given to you by your health care provider. Make sure you discuss any questions you have with your health care provider. Document Revised: 10/01/2019 Document Reviewed: 10/01/2019 Elsevier Patient Education  2022 [employer].

## 2021-10-04 LAB — IRON,TIBC AND FERRITIN PANEL
%SAT: 11 % (calc) — ABNORMAL LOW (ref 16–45)
Ferritin: 24 ng/mL (ref 16–154)
Iron: 57 ug/dL (ref 40–190)
TIBC: 513 mcg/dL (calc) — ABNORMAL HIGH (ref 250–450)

## 2021-10-07 DIAGNOSIS — D500 Iron deficiency anemia secondary to blood loss (chronic): Secondary | ICD-10-CM | POA: Insufficient documentation

## 2021-10-07 NOTE — Assessment & Plan Note (Signed)
Repeated BP in supine position, still elevated. Asymptomatic BP Readings from Last 3 Encounters:  10/03/21 (!) 150/100  09/08/20 140/90  05/03/19 122/86   Check BP 3x/week and send readings through mychart in 1week. Maintain DASH diet, adequate oral hydration and daily exercise.

## 2021-10-07 NOTE — Assessment & Plan Note (Signed)
Repeat cbc and iron panel. Current use of progesterone, managed by GYN.

## 2022-01-03 ENCOUNTER — Ambulatory Visit: Payer: BC Managed Care – PPO | Admitting: Nurse Practitioner

## 2023-08-13 ENCOUNTER — Encounter: Payer: Self-pay | Admitting: Nurse Practitioner
